# Patient Record
Sex: Male | Born: 1950 | Race: White | Hispanic: Refuse to answer | Marital: Married | Smoking: Never smoker
Health system: Southern US, Community
[De-identification: ages and names within clinical notes are randomized; demographics above are authoritative.]

## PROBLEM LIST (undated history)

## (undated) DIAGNOSIS — F419 Anxiety disorder, unspecified: Secondary | ICD-10-CM

## (undated) DIAGNOSIS — Z87442 Personal history of urinary calculi: Secondary | ICD-10-CM

## (undated) DIAGNOSIS — J301 Allergic rhinitis due to pollen: Secondary | ICD-10-CM

## (undated) DIAGNOSIS — K219 Gastro-esophageal reflux disease without esophagitis: Secondary | ICD-10-CM

## (undated) DIAGNOSIS — E7800 Pure hypercholesterolemia, unspecified: Secondary | ICD-10-CM

## (undated) DIAGNOSIS — M1990 Unspecified osteoarthritis, unspecified site: Secondary | ICD-10-CM

## (undated) DIAGNOSIS — F329 Major depressive disorder, single episode, unspecified: Secondary | ICD-10-CM

## (undated) DIAGNOSIS — I10 Essential (primary) hypertension: Secondary | ICD-10-CM

## (undated) DIAGNOSIS — J189 Pneumonia, unspecified organism: Secondary | ICD-10-CM

## (undated) DIAGNOSIS — F32A Depression, unspecified: Secondary | ICD-10-CM

## (undated) HISTORY — PX: TONSILLECTOMY: SUR1361

## (undated) HISTORY — PX: COLONOSCOPY: SHX174

---

## 2003-07-26 ENCOUNTER — Emergency Department (HOSPITAL_COMMUNITY): Admission: EM | Admit: 2003-07-26 | Discharge: 2003-07-26 | Payer: Self-pay | Admitting: Emergency Medicine

## 2005-09-08 ENCOUNTER — Encounter: Admission: RE | Admit: 2005-09-08 | Discharge: 2005-09-08 | Payer: Self-pay | Admitting: Family Medicine

## 2006-08-24 DIAGNOSIS — J189 Pneumonia, unspecified organism: Secondary | ICD-10-CM

## 2006-08-24 HISTORY — DX: Pneumonia, unspecified organism: J18.9

## 2007-04-11 ENCOUNTER — Encounter: Admission: RE | Admit: 2007-04-11 | Discharge: 2007-04-11 | Payer: Self-pay | Admitting: Family Medicine

## 2013-02-13 ENCOUNTER — Encounter (HOSPITAL_COMMUNITY): Payer: Self-pay | Admitting: Pharmacy Technician

## 2013-02-13 NOTE — Progress Notes (Signed)
Surgery clearance note 12/22/12 Dr. Azucena Cecil on chart.

## 2013-02-13 NOTE — Patient Instructions (Addendum)
20 DAM ASHRAF  02/13/2013   Your procedure is scheduled on: 02/20/13  Report to [facility] at 11:00 AM.  Call this number if you have problems the morning of surgery 336-: [phone]   Remember:   Do not eat food After Midnight, clear liquids from midnight until 7:30 am on 02/20/13 then nothing.      Do not wear jewelry, make-up or nail polish.  Do not wear lotions, powders, or perfumes. You may wear deodorant.  Do not shave 48 hours prior to surgery. Men may shave face and neck.  Do not bring valuables to the hospital.  Contacts, dentures or bridgework may not be worn into surgery.  Leave suitcase in the car. After surgery it may be brought to your room.  For patients admitted to the hospital, checkout time is 11:00 AM the day of discharge.   Please read over the following fact sheets that you were given: MRSA Information, blood fact sheet, incentive spirometry fact sheet, clear liquids fact sheet.  Birdie Sons, RN  pre op nurse call if needed [phone]    FAILURE TO FOLLOW THESE INSTRUCTIONS MAY RESULT IN CANCELLATION OF YOUR SURGERY   Patient Signature: ___________________________________________

## 2013-02-14 ENCOUNTER — Encounter (HOSPITAL_COMMUNITY)
Admission: RE | Admit: 2013-02-14 | Discharge: 2013-02-14 | Disposition: A | Discharge status: Home / Self Care | Payer: PRIVATE HEALTH INSURANCE | Source: Ambulatory Visit | Attending: Orthopedic Surgery | Admitting: Orthopedic Surgery

## 2013-02-14 ENCOUNTER — Encounter (HOSPITAL_COMMUNITY): Payer: Self-pay

## 2013-02-14 DIAGNOSIS — Z01818 Encounter for other preprocedural examination: Secondary | ICD-10-CM | POA: Insufficient documentation

## 2013-02-14 DIAGNOSIS — Z01812 Encounter for preprocedural laboratory examination: Secondary | ICD-10-CM | POA: Insufficient documentation

## 2013-02-14 HISTORY — DX: Gastro-esophageal reflux disease without esophagitis: K21.9

## 2013-02-14 HISTORY — DX: Major depressive disorder, single episode, unspecified: F32.9

## 2013-02-14 HISTORY — DX: Allergic rhinitis due to pollen: J30.1

## 2013-02-14 HISTORY — DX: Anxiety disorder, unspecified: F41.9

## 2013-02-14 HISTORY — DX: Unspecified osteoarthritis, unspecified site: M19.90

## 2013-02-14 HISTORY — DX: Pneumonia, unspecified organism: J18.9

## 2013-02-14 HISTORY — DX: Personal history of urinary calculi: Z87.442

## 2013-02-14 HISTORY — DX: Depression, unspecified: F32.A

## 2013-02-14 LAB — BASIC METABOLIC PANEL
BUN: 14 mg/dL (ref 6–23)
CO2: 28 mEq/L (ref 19–32)
Calcium: 9.6 mg/dL (ref 8.4–10.5)
Chloride: 105 mEq/L (ref 96–112)
Creatinine, Ser: 0.94 mg/dL (ref 0.50–1.35)
GFR calc Af Amer: >90 mL/min (ref >90)
GFR calc non Af Amer: 88 mL/min — ABNORMAL LOW (ref >90)
Glucose, Bld: 98 mg/dL (ref 70–99)
Potassium: 4.6 mEq/L (ref 3.5–5.1)
Sodium: 139 mEq/L (ref 135–145)

## 2013-02-14 LAB — URINALYSIS, ROUTINE W REFLEX MICROSCOPIC
Bilirubin Urine: NEGATIVE
Nitrite: NEGATIVE
Specific Gravity, Urine: 1.03 (ref 1.005–1.030)
Urobilinogen, UA: 0.2 mg/dL (ref 0.0–1.0)

## 2013-02-14 LAB — CBC
HCT: 44 % (ref 39.0–52.0)
Hemoglobin: 14.6 g/dL (ref 13.0–17.0)
MCH: 27.8 pg (ref 26.0–34.0)
MCHC: 33.2 g/dL (ref 30.0–36.0)
MCV: 83.8 fL (ref 78.0–100.0)
Platelets: 176 10*3/uL (ref 150–400)
RBC: 5.25 MIL/uL (ref 4.22–5.81)
RDW: 13.6 % (ref 11.5–15.5)
WBC: 4.9 10*3/uL (ref 4.0–10.5)

## 2013-02-14 LAB — PROTIME-INR
INR: 0.95 (ref 0.00–1.49)
Prothrombin Time: 12.6 seconds (ref 11.6–15.2)

## 2013-02-14 LAB — SURGICAL PCR SCREEN
MRSA, PCR: NEGATIVE
Staphylococcus aureus: NEGATIVE

## 2013-02-14 LAB — APTT: aPTT: 30 seconds (ref 24–37)

## 2013-02-16 NOTE — H&P (Signed)
TOTAL KNEE ADMISSION H&P  Patient is being admitted for right medial unicomartmental knee arthroplasty.  Subjective:  Chief Complaint:  Right knee medial compartment OA / pain.  HPI: Lucas Santiago, 62 y.o. male, has a history of pain and functional disability in the right knee due to arthritis and has failed non-surgical conservative treatments for greater than 12 weeks to include  NSAID's and/or analgesics, corticosteriod injections, viscosupplementation injections and activity modification.  Onset of symptoms was gradual, starting 2 years ago with rapidlly worsening course since that time. The patient noted no past surgery on the right knee(s).  Patient currently rates pain in the right knee(s) at 6 out of 10 with activity. Patient has worsening of pain with activity and weight bearing, pain that interferes with activities of daily living, pain with passive range of motion, crepitus and joint swelling.  Patient has evidence of periarticular osteophytes and joint space narrowing of the medial compartment by imaging studies.  There is no active signs of infection.  Risks, benefits and expectations were discussed with the patient. Patient understand the risks, benefits and expectations and wishes to proceed with surgery.   D/C Plans:   Home with HHPT  Post-op Meds:   Rx given for ASA, Zanaflex, Iron, Colace and MiraLax  Tranexamic Acid:   To be given  Decadron:    To be given  FYI:    ASA post-op    Past Medical History  Diagnosis Date  . 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 Anxiety   . Depression   . Pneumonia 2008    x2  . History of kidney stones 15 years ago  . GERD (gastroesophageal reflux disease)   . Arthritis     knees    Past Surgical History  Procedure Laterality Date  . Tonsillectomy  as child     Allergies:   NKDA    History  Substance Use Topics  . Smoking status: Never Smoker   . Smokeless tobacco: Never Used  . Alcohol Use: Yes     Comment: occasional        Review of Systems  Constitutional: Negative.   HENT: Negative.   Eyes: Negative.   Respiratory: Negative.   Cardiovascular: Negative.   Gastrointestinal: Positive for heartburn.  Genitourinary: Negative.   Musculoskeletal: Positive for joint pain.  Skin: Negative.   Neurological: Negative.   Endo/Heme/Allergies: Positive for environmental allergies.  Psychiatric/Behavioral: Positive for depression. The patient is nervous/anxious.     Objective:  Physical Exam  Constitutional: He is oriented to person, place, and time. He appears well-developed and well-nourished.  HENT:  Head: Normocephalic and atraumatic.  Mouth/Throat: Oropharynx is clear and moist.  Eyes: Pupils are equal, round, and reactive to light.  Neck: Neck supple. No JVD present. No tracheal deviation present. No thyromegaly present.  Cardiovascular: Normal rate, regular rhythm, normal heart sounds and intact distal pulses.   Respiratory: Effort normal and breath sounds normal. No stridor. No respiratory distress. He has no wheezes.  GI: Soft. There is no tenderness. There is no guarding.  Musculoskeletal:       Right knee: He exhibits decreased range of motion, swelling and bony tenderness. He exhibits no effusion, no ecchymosis, no deformity, no laceration and no erythema. Tenderness found. Medial joint line tenderness noted. No lateral joint line tenderness noted.  Lymphadenopathy:    He has no cervical adenopathy.  Neurological: He is alert and oriented to person, place, and time.  Skin: Skin is  warm and dry.  Psychiatric: He has a normal mood and affect.     Imaging Review Plain radiographs demonstrate moderate degenerative joint disease of the right knee, medial compartment. The overall alignment is neutral. The bone quality appears to be good for age and reported activity level.  Assessment/Plan:  End stage arthritis, right knee, medial compartment.  The patient history, physical examination, clinical  judgment of the provider and imaging studies are consistent with end stage degenerative joint disease of the right knee(s) and medial unicompartmental knee arthroplasty is deemed medically necessary. The treatment options including medical management, injection therapy arthroscopy and arthroplasty were discussed at length. The risks and benefits of total knee arthroplasty were presented and reviewed. The risks due to aseptic loosening, infection, stiffness, patella tracking problems, thromboembolic complications and other imponderables were discussed. The patient acknowledged the explanation, agreed to proceed with the plan and consent was signed. Patient is being admitted for inpatient treatment for surgery, pain control, PT, OT, prophylactic antibiotics, VTE prophylaxis, progressive ambulation and ADL's and discharge planning. The patient is planning to be discharged home with home health services.   Anastasio Auerbach Penney Domanski   PAC  02/16/2013, 10:32 AM

## 2013-02-20 ENCOUNTER — Encounter (HOSPITAL_COMMUNITY): Payer: Self-pay | Admitting: Anesthesiology

## 2013-02-20 ENCOUNTER — Inpatient Hospital Stay (HOSPITAL_COMMUNITY)
Admission: RE | Admit: 2013-02-20 | Discharge: 2013-02-21 | DRG: 470 | Disposition: A | Discharge status: Home Health | Payer: PRIVATE HEALTH INSURANCE | Source: Ambulatory Visit | Attending: Orthopedic Surgery | Admitting: Orthopedic Surgery

## 2013-02-20 ENCOUNTER — Encounter (HOSPITAL_COMMUNITY): Payer: Self-pay | Admitting: *Deleted

## 2013-02-20 ENCOUNTER — Encounter (HOSPITAL_COMMUNITY)
Admission: RE | Disposition: A | Discharge status: Home Health | Payer: Self-pay | Source: Ambulatory Visit | Attending: Orthopedic Surgery

## 2013-02-20 ENCOUNTER — Ambulatory Visit (HOSPITAL_COMMUNITY): Payer: PRIVATE HEALTH INSURANCE | Admitting: Anesthesiology

## 2013-02-20 DIAGNOSIS — Z96651 Presence of right artificial knee joint: Secondary | ICD-10-CM

## 2013-02-20 DIAGNOSIS — E663 Overweight: Secondary | ICD-10-CM

## 2013-02-20 DIAGNOSIS — Z01812 Encounter for preprocedural laboratory examination: Secondary | ICD-10-CM

## 2013-02-20 DIAGNOSIS — M1710 Unilateral primary osteoarthritis, unspecified knee: Principal | ICD-10-CM | POA: Diagnosis present

## 2013-02-20 HISTORY — PX: PARTIAL KNEE ARTHROPLASTY: SHX2174

## 2013-02-20 LAB — TYPE AND SCREEN
ABO/RH(D): O POS
Antibody Screen: NEGATIVE

## 2013-02-20 SURGERY — ARTHROPLASTY, KNEE, UNICOMPARTMENTAL
Anesthesia: Spinal | Site: Knee | Laterality: Right | Wound class: Clean

## 2013-02-20 MED ORDER — CEFAZOLIN SODIUM-DEXTROSE 2-3 GM-% IV SOLR
2.0000 g | INTRAVENOUS | Status: AC
  Administered 2013-02-20: 2 g via INTRAVENOUS

## 2013-02-20 MED ORDER — MEPERIDINE HCL 50 MG/ML IJ SOLN
6.2500 mg | INTRAMUSCULAR | Status: DC | PRN
  Administered 2013-02-20: 12.5 mg via INTRAVENOUS

## 2013-02-20 MED ORDER — LIDOCAINE HCL 2 % EX GEL
Freq: Once | CUTANEOUS | Status: AC
  Administered 2013-02-21: 5 via URETHRAL
  Filled 2013-02-20: qty 5

## 2013-02-20 MED ORDER — METHOCARBAMOL 500 MG PO TABS: 500.0000 mg | ORAL_TABLET | Freq: Four times a day (QID) | ORAL | Status: DC | PRN

## 2013-02-20 MED ORDER — BISACODYL 10 MG RE SUPP: 10.0000 mg | Freq: Every day | RECTAL | Status: DC | PRN

## 2013-02-20 MED ORDER — METHOCARBAMOL 100 MG/ML IJ SOLN
500.0000 mg | Freq: Four times a day (QID) | INTRAVENOUS | Status: DC | PRN
  Administered 2013-02-20: 500 mg via INTRAVENOUS
  Filled 2013-02-20: qty 5

## 2013-02-20 MED ORDER — HYDROMORPHONE HCL PF 1 MG/ML IJ SOLN: 0.5000 mg | INTRAMUSCULAR | Status: DC | PRN

## 2013-02-20 MED ORDER — MIDAZOLAM HCL 5 MG/5ML IJ SOLN
INTRAMUSCULAR | Status: DC | PRN
  Administered 2013-02-20: 2 mg via INTRAVENOUS

## 2013-02-20 MED ORDER — LACTATED RINGERS IV SOLN
INTRAVENOUS | Status: DC
  Administered 2013-02-20: 1000 mL via INTRAVENOUS
  Administered 2013-02-20: 15:00:00 via INTRAVENOUS

## 2013-02-20 MED ORDER — SODIUM CHLORIDE 0.9 % IV SOLN
INTRAVENOUS | Status: DC
  Administered 2013-02-20 – 2013-02-21 (×2): via INTRAVENOUS
  Filled 2013-02-20 (×5): qty 1000

## 2013-02-20 MED ORDER — PHENOL 1.4 % MT LIQD: 1.0000 | OROMUCOSAL | Status: DC | PRN

## 2013-02-20 MED ORDER — CEFAZOLIN SODIUM-DEXTROSE 2-3 GM-% IV SOLR
2.0000 g | Freq: Four times a day (QID) | INTRAVENOUS | Status: AC
  Administered 2013-02-20 – 2013-02-21 (×2): 2 g via INTRAVENOUS
  Filled 2013-02-20 (×2): qty 50

## 2013-02-20 MED ORDER — PHENYLEPHRINE HCL 10 MG/ML IJ SOLN
10.0000 mg | INTRAVENOUS | Status: DC | PRN
  Administered 2013-02-20: 40 ug/min via INTRAVENOUS

## 2013-02-20 MED ORDER — SODIUM CHLORIDE 0.9 % IJ SOLN
INTRAMUSCULAR | Status: DC | PRN
  Administered 2013-02-20: 16:00:00

## 2013-02-20 MED ORDER — PROPOFOL 10 MG/ML IV BOLUS
INTRAVENOUS | Status: DC | PRN
  Administered 2013-02-20: 30 mg via INTRAVENOUS

## 2013-02-20 MED ORDER — MENTHOL 3 MG MT LOZG: 1.0000 | LOZENGE | OROMUCOSAL | Status: DC | PRN

## 2013-02-20 MED ORDER — HYDROMORPHONE HCL PF 1 MG/ML IJ SOLN: 0.2500 mg | INTRAMUSCULAR | Status: DC | PRN

## 2013-02-20 MED ORDER — BUPIVACAINE-EPINEPHRINE 0.25% -1:200000 IJ SOLN
INTRAMUSCULAR | Status: DC | PRN
  Administered 2013-02-20: 25 mL

## 2013-02-20 MED ORDER — METOCLOPRAMIDE HCL 10 MG PO TABS: 5.0000 mg | ORAL_TABLET | Freq: Three times a day (TID) | ORAL | Status: DC | PRN

## 2013-02-20 MED ORDER — LACTATED RINGERS IV SOLN: INTRAVENOUS | Status: DC

## 2013-02-20 MED ORDER — ALUM & MAG HYDROXIDE-SIMETH 200-200-20 MG/5ML PO SUSP: 30.0000 mL | ORAL | Status: DC | PRN

## 2013-02-20 MED ORDER — FAMOTIDINE 40 MG PO TABS
40.0000 mg | ORAL_TABLET | Freq: Every day | ORAL | Status: DC
  Administered 2013-02-20: 40 mg via ORAL
  Filled 2013-02-20 (×2): qty 1

## 2013-02-20 MED ORDER — ZOLPIDEM TARTRATE 5 MG PO TABS: 5.0000 mg | ORAL_TABLET | Freq: Every evening | ORAL | Status: DC | PRN

## 2013-02-20 MED ORDER — CELECOXIB 200 MG PO CAPS
200.0000 mg | ORAL_CAPSULE | Freq: Two times a day (BID) | ORAL | Status: DC
  Administered 2013-02-20 – 2013-02-21 (×2): 200 mg via ORAL
  Filled 2013-02-20 (×3): qty 1

## 2013-02-20 MED ORDER — 0.9 % SODIUM CHLORIDE (POUR BTL) OPTIME
TOPICAL | Status: DC | PRN
  Administered 2013-02-20: 1000 mL

## 2013-02-20 MED ORDER — ONDANSETRON HCL 4 MG/2ML IJ SOLN: 4.0000 mg | Freq: Four times a day (QID) | INTRAMUSCULAR | Status: DC | PRN

## 2013-02-20 MED ORDER — KETOROLAC TROMETHAMINE 15 MG/ML IJ SOLN
INTRAMUSCULAR | Status: DC | PRN
  Administered 2013-02-20: 30 mg

## 2013-02-20 MED ORDER — FLEET ENEMA 7-19 GM/118ML RE ENEM: 1.0000 | ENEMA | Freq: Once | RECTAL | Status: AC | PRN

## 2013-02-20 MED ORDER — FENTANYL CITRATE 0.05 MG/ML IJ SOLN
INTRAMUSCULAR | Status: DC | PRN
  Administered 2013-02-20 (×2): 50 ug via INTRAVENOUS

## 2013-02-20 MED ORDER — DIPHENHYDRAMINE HCL 25 MG PO CAPS: 25.0000 mg | ORAL_CAPSULE | Freq: Four times a day (QID) | ORAL | Status: DC | PRN

## 2013-02-20 MED ORDER — ASPIRIN EC 325 MG PO TBEC
325.0000 mg | DELAYED_RELEASE_TABLET | Freq: Two times a day (BID) | ORAL | Status: DC
  Administered 2013-02-21: 325 mg via ORAL
  Filled 2013-02-20 (×3): qty 1

## 2013-02-20 MED ORDER — DEXAMETHASONE SODIUM PHOSPHATE 10 MG/ML IJ SOLN
10.0000 mg | Freq: Once | INTRAMUSCULAR | Status: DC
  Filled 2013-02-20: qty 1

## 2013-02-20 MED ORDER — DEXAMETHASONE SODIUM PHOSPHATE 10 MG/ML IJ SOLN
10.0000 mg | Freq: Once | INTRAMUSCULAR | Status: AC
  Administered 2013-02-20: 10 mg via INTRAVENOUS

## 2013-02-20 MED ORDER — POLYETHYLENE GLYCOL 3350 17 G PO PACK
17.0000 g | PACK | Freq: Two times a day (BID) | ORAL | Status: DC
  Administered 2013-02-20 – 2013-02-21 (×2): 17 g via ORAL

## 2013-02-20 MED ORDER — METOCLOPRAMIDE HCL 5 MG/ML IJ SOLN: 5.0000 mg | Freq: Three times a day (TID) | INTRAMUSCULAR | Status: DC | PRN

## 2013-02-20 MED ORDER — BUPIVACAINE LIPOSOME 1.3 % IJ SUSP
20.0000 mL | Freq: Once | INTRAMUSCULAR | Status: DC
  Filled 2013-02-20: qty 20

## 2013-02-20 MED ORDER — ONDANSETRON HCL 4 MG PO TABS: 4.0000 mg | ORAL_TABLET | Freq: Four times a day (QID) | ORAL | Status: DC | PRN

## 2013-02-20 MED ORDER — TRANEXAMIC ACID 100 MG/ML IV SOLN
1000.0000 mg | Freq: Once | INTRAVENOUS | Status: AC
  Administered 2013-02-20: 1000 mg via INTRAVENOUS
  Filled 2013-02-20: qty 10

## 2013-02-20 MED ORDER — PROPOFOL INFUSION 10 MG/ML OPTIME
INTRAVENOUS | Status: DC | PRN
  Administered 2013-02-20: 75 ug/kg/min via INTRAVENOUS

## 2013-02-20 MED ORDER — DOCUSATE SODIUM 100 MG PO CAPS
100.0000 mg | ORAL_CAPSULE | Freq: Two times a day (BID) | ORAL | Status: DC
  Administered 2013-02-20 – 2013-02-21 (×2): 100 mg via ORAL

## 2013-02-20 MED ORDER — FERROUS SULFATE 325 (65 FE) MG PO TABS
325.0000 mg | ORAL_TABLET | Freq: Three times a day (TID) | ORAL | Status: DC
  Administered 2013-02-21: 325 mg via ORAL
  Filled 2013-02-20 (×4): qty 1

## 2013-02-20 MED ORDER — ONDANSETRON HCL 4 MG/2ML IJ SOLN
INTRAMUSCULAR | Status: DC | PRN
  Administered 2013-02-20: 4 mg via INTRAVENOUS

## 2013-02-20 MED ORDER — BUPIVACAINE HCL (PF) 0.75 % IJ SOLN
INTRAMUSCULAR | Status: DC | PRN
  Administered 2013-02-20: 15 mg

## 2013-02-20 MED ORDER — HYDROCODONE-ACETAMINOPHEN 7.5-325 MG PO TABS
1.0000 | ORAL_TABLET | ORAL | Status: DC
  Administered 2013-02-20 – 2013-02-21 (×5): 1 via ORAL
  Filled 2013-02-20 (×5): qty 1

## 2013-02-20 SURGICAL SUPPLY — 55 items
ADH SKN CLS APL DERMABOND .7 (GAUZE/BANDAGES/DRESSINGS) ×1
BAG SPEC THK2 15X12 ZIP CLS (MISCELLANEOUS) ×1
BAG ZIPLOCK 12X15 (MISCELLANEOUS) ×2 IMPLANT
BANDAGE ELASTIC 6 VELCRO ST LF (GAUZE/BANDAGES/DRESSINGS) ×2 IMPLANT
BANDAGE ESMARK 6X9 LF (GAUZE/BANDAGES/DRESSINGS) ×1 IMPLANT
BLADE SAW RECIPROCATING 77.5 (BLADE) ×2 IMPLANT
BLADE SAW SGTL 13.0X1.19X90.0M (BLADE) ×2 IMPLANT
BNDG CMPR 9X6 STRL LF SNTH (GAUZE/BANDAGES/DRESSINGS) ×1
BNDG ESMARK 6X9 LF (GAUZE/BANDAGES/DRESSINGS) ×2
BOWL SMART MIX CTS (DISPOSABLE) ×2 IMPLANT
CAPT KNEE OXFORD ×1 IMPLANT
CATH FOLEY 2WAY SLVR  5CC 16FR (CATHETERS) ×1
CATH FOLEY 2WAY SLVR 5CC 16FR (CATHETERS) IMPLANT
CEMENT HV SMART SET (Cement) ×2 IMPLANT
CLOTH BEACON ORANGE TIMEOUT ST (SAFETY) ×2 IMPLANT
CUFF TOURN SGL QUICK 34 (TOURNIQUET CUFF) ×2
CUFF TRNQT CYL 34X4X40X1 (TOURNIQUET CUFF) ×1 IMPLANT
DERMABOND ADVANCED (GAUZE/BANDAGES/DRESSINGS) ×1
DERMABOND ADVANCED .7 DNX12 (GAUZE/BANDAGES/DRESSINGS) ×1 IMPLANT
DRAPE EXTREMITY T 121X128X90 (DRAPE) ×2 IMPLANT
DRAPE POUCH INSTRU U-SHP 10X18 (DRAPES) ×2 IMPLANT
DRAPE U-SHAPE 47X51 STRL (DRAPES) ×2 IMPLANT
DRSG AQUACEL AG ADV 3.5X 6 (GAUZE/BANDAGES/DRESSINGS) ×1 IMPLANT
DRSG AQUACEL AG ADV 3.5X10 (GAUZE/BANDAGES/DRESSINGS) ×2 IMPLANT
DRSG TEGADERM 4X4.75 (GAUZE/BANDAGES/DRESSINGS) ×2 IMPLANT
DURAPREP 26ML APPLICATOR (WOUND CARE) ×2 IMPLANT
ELECT REM PT RETURN 9FT ADLT (ELECTROSURGICAL) ×2
ELECTRODE REM PT RTRN 9FT ADLT (ELECTROSURGICAL) ×1 IMPLANT
EVACUATOR 1/8 PVC DRAIN (DRAIN) ×2 IMPLANT
FACESHIELD LNG OPTICON STERILE (SAFETY) ×8 IMPLANT
GAUZE SPONGE 2X2 8PLY STRL LF (GAUZE/BANDAGES/DRESSINGS) ×1 IMPLANT
GLOVE BIOGEL PI IND STRL 7.5 (GLOVE) ×1 IMPLANT
GLOVE BIOGEL PI IND STRL 8 (GLOVE) IMPLANT
GLOVE BIOGEL PI INDICATOR 7.5 (GLOVE) ×1
GLOVE BIOGEL PI INDICATOR 8 (GLOVE) ×1
GLOVE ECLIPSE 8.0 STRL XLNG CF (GLOVE) ×2 IMPLANT
GLOVE ORTHO TXT STRL SZ7.5 (GLOVE) ×4 IMPLANT
GOWN BRE IMP PREV XXLGXLNG (GOWN DISPOSABLE) ×4 IMPLANT
GOWN STRL NON-REIN LRG LVL3 (GOWN DISPOSABLE) ×2 IMPLANT
KIT BASIN OR (CUSTOM PROCEDURE TRAY) ×2 IMPLANT
LEGGING LITHOTOMY PAIR STRL (DRAPES) ×2 IMPLANT
MANIFOLD NEPTUNE II (INSTRUMENTS) ×2 IMPLANT
NDL SAFETY ECLIPSE 18X1.5 (NEEDLE) ×1 IMPLANT
NEEDLE HYPO 18GX1.5 SHARP (NEEDLE) ×2
PACK TOTAL JOINT (CUSTOM PROCEDURE TRAY) ×2 IMPLANT
POSITIONER SURGICAL ARM (MISCELLANEOUS) ×2 IMPLANT
SPONGE GAUZE 2X2 STER 10/PKG (GAUZE/BANDAGES/DRESSINGS) ×1
SUCTION FRAZIER TIP 10 FR DISP (SUCTIONS) ×2 IMPLANT
SUT MNCRL AB 4-0 PS2 18 (SUTURE) ×2 IMPLANT
SUT VIC AB 1 CT1 36 (SUTURE) ×4 IMPLANT
SUT VIC AB 2-0 CT1 27 (SUTURE) ×4
SUT VIC AB 2-0 CT1 TAPERPNT 27 (SUTURE) ×2 IMPLANT
SYR 50ML LL SCALE MARK (SYRINGE) ×2 IMPLANT
TOWEL OR 17X26 10 PK STRL BLUE (TOWEL DISPOSABLE) ×3 IMPLANT
TRAY FOLEY CATH 14FRSI W/METER (CATHETERS) ×2 IMPLANT

## 2013-02-20 NOTE — Transfer of Care (Signed)
Immediate Anesthesia Transfer of Care Note  Patient: Lucas Santiago  Procedure(s) Performed: Procedure(s): RIGHT KNEE MEDIAL UNICOMPARTMENTAL ARTHROPLASTY (Right)  Patient Location: PACU  Anesthesia Type:Spinal  Level of Consciousness: awake, sedated and patient cooperative  Airway & Oxygen Therapy: Patient Spontanous Breathing and Patient connected to face mask oxygen  Post-op Assessment: Report given to PACU RN, Post -op Vital signs reviewed and stable and SAB level T12.  Post vital signs: Reviewed and stable  Complications: No apparent anesthesia complications

## 2013-02-20 NOTE — Interval H&P Note (Signed)
History and Physical Interval Note:  02/20/2013 1:28 PM  Lucas Santiago  has presented today for surgery, with the diagnosis of Right Knee Medial compartmental Osteoarthritis  The various methods of treatment have been discussed with the patient and family. After consideration of risks, benefits and other options for treatment, the patient has consented to  Procedure(s): RIGHT UNICOMPARTMENTAL KNEE (Right) as a surgical intervention .  The patient's history has been reviewed, patient examined, no change in status, stable for surgery.  I have reviewed the patient's chart and labs.  Questions were answered to the patient's satisfaction.     Shelda Pal

## 2013-02-20 NOTE — Anesthesia Procedure Notes (Signed)
Spinal  Patient location during procedure: OR Start time: 02/20/2013 2:11 PM End time: 02/20/2013 2:15 PM Staffing CRNA/Resident: Paris Lore Performed by: resident/CRNA  Preanesthetic Checklist Completed: patient identified, site marked, surgical consent, pre-op evaluation, timeout performed, IV checked, risks and benefits discussed and monitors and equipment checked Spinal Block Patient position: sitting Prep: Betadine Patient monitoring: heart rate, continuous pulse ox and blood pressure Approach: right paramedian Location: L2-3 Injection technique: single-shot Needle Needle type: Spinocan and Sprotte  Needle gauge: 24 G Needle length: 9 cm Needle insertion depth: 4 cm Assessment Sensory level: T4 Additional Notes Expiration date of kit checked and confirmed. Patient tolerated procedure well, without complications. Sitting position X 1 attempt noted clear CSF return and easy aspiration and administration of medication.

## 2013-02-20 NOTE — Plan of Care (Signed)
Problem: Consults Goal: Diagnosis- Total Joint Replacement Outcome: Completed/Met Date Met:  02/20/13 Partial/Uniknee

## 2013-02-20 NOTE — Op Note (Signed)
NAME: Lucas Santiago    MEDICAL RECORD NO.: [id number]   FACILITY: [facility]   DATE OF BIRTH: July 20, 1951  PHYSICIAN: Madlyn Frankel. Charlann Boxer, M.D.    DATE OF PROCEDURE: 02/20/2013    OPERATIVE REPORT   PREOPERATIVE DIAGNOSIS: Right knee medial compartment osteoarthritis.   POSTOPERATIVE DIAGNOSIS: Right knee medial compartment osteoarthritis.  PROCEDURE: Right partial knee replacement utilizing Biomet Oxford knee  component, size medium femur, a right medial size A tibial tray with a size 5 insert.   SURGEON: Madlyn Frankel. Charlann Boxer, M.D.   ASSISTANT: Lanney Gins, PAC.  Please note that Mr. Lucas Santiago was present for the entirety of the case,  utilized for preoperative positioning, perioperative retractor  management, general facilitation of the case and primary wound closure.   ANESTHESIA: Spinal.   SPECIMENS: None.   COMPLICATIONS: None.  DRAINS: 1 medium HV   TOURNIQUET TIME: 38 minutes at 250 mmHg.   INDICATIONS FOR PROCEDURE: Mr. Lucas Santiago is a 61yo patient of mine who presented for evaluation of right knee pain.  He presented with primary complaints of pain on the medial side of their knee. Radiographs revealed advanced medial compartment arthritis with specifically an antero-medial wear pattern.  There was bone on bone changes noted with subchondral sclerosis and osteophytes present. The patient has had progressive problems failing to respond to conservative measures of medications, injections and activity modification. Risks of infection, DVT, component failure, need for future revision surgery were all discussed and reviewed.  Consent was obtained for benefit of pain relief.   PROCEDURE IN DETAIL: The patient was brought to the operative theater.  Once adequate anesthesia, preoperative antibiotics, 2gm Ancef administered, the patient was positioned in supine position with a right thigh tourniquet  placed. The right lower extremity was prepped and draped in sterile  fashion with the leg on the  Oxford leg holder.  The leg was allowed to flex to 120 degrees. A time-out  was performed identifying the patient, planned procedure, and extremity.  The leg was exsanguinated, tourniquet elevated to 250 mmHg. A midline  incision was made from the proximal pole of the patella to the tibial tubercle. A  soft tissue plane was created and partial median arthrotomy was then  made to allow for subluxation of the patella. Following initial synovectomy and  debridement, the osteophytes were removed off the medial aspect of the  knee.   Attention was first directed to the tibia. The tibial  extramedullary guide was positioned over the anterior crest of the tibia  and pinned into position, and using a measured resection guide from the  Oxford system, a 4 mm resection was made off the proximal tibia. First  the reciprocating saw along the medial aspect of the tibial spines, then the oscillating saw.    At this point, I sized this cut surface seem to be best fit for a size A or B tibial tray.  With the retractors out of the wound and the knee held at 90 degrees the 4 feeler gauge had appropriate tension on the medial ligament.   At this point, the femoral canal was opened with a drill and the  intramedullary rod passed. Then using the guide for a medium resection off  the posterior aspect of the femur was positioned over the mid portion of the medial femoral condyle.  The orientation was set using the guide that mates the femoral guide to the intramedullary rod.  The 2 drill holes were made into the distal femur.  The posterior guide  was then impacted into place and the posterior  femoral cut made.  At this point, I milled the distal femur with a size 4 spigot in place. At this point, we did a trial reduction of the medium femur, size A tibial tray and a 4 feeler gauge. At 90 degrees of  flexion and at 20 degrees of flexion the knee had symmetric tension on  the ligaments.   Given these findings,  the trial femoral component was removed. Final preparation of tibia was carried out by pinning it in position. Then  using a reciprocating saw I removed bone for the keel. Further bone was  removed with an osteotome.  Trial reduction was now carried out with the medium femur, the size A keeled tibia, and a 5 lollipop insert. The balance of the  ligaments appeared to be symmetric at 20 degrees and 90 degrees. Given  all these findings, the trial components were removed.   Cement was mixed. The final components were opened. The knee was irrigated with  normal saline solution. Then final debridements of the  soft tissue was carried out, I also drilled the sclerotic bone with a drill.  The final components were cemented with a single batch of cement in a  two-stage technique with the tibial component cemented first. The knee  was then brought  to 45 degrees of flexion with a 5 feeler gauge, held with pressure for a minute and half.  After this the femoral component was cemented in place.  The knee was again held at 45 degrees of flexion while the cement fully cured.  Excess cement was removed throughout the knee. Tourniquet was let down  after 38 minutes. After the cement had fully cured and excessive cement  was removed throughout the knee there was no visualized cement present.   The final size 5 medial insert to match a medium femur was chosen and snapped into position. We re-irrigated  the knee. I placed a medium Hemovac drain deep. The extensor mechanism  was then reapproximated using a #1 Vicryl with the knee in flexion. The  remaining wound was closed with 2-0 Vicryl and a running 4-0 Monocryl.  The knee was cleaned, dried, and dressed sterilely using Dermabond and  Aquacel dressing. The drain site was dressed separately. The patient  was brought to the recovery room, Ace wrap in place, tolerating the  procedure well. He will be in the hospital for overnight observation.  We will  initiate physical therapy and progress to ambulate.     Madlyn Frankel Charlann Boxer, M.D.

## 2013-02-20 NOTE — Anesthesia Preprocedure Evaluation (Signed)
Anesthesia Evaluation  Patient identified by MRN, date of birth, ID band Patient awake    Reviewed: Allergy & Precautions, H&P , NPO status , Patient's Chart, lab work & pertinent test results  Airway Mallampati: II TM Distance: >3 FB Neck ROM: full    Dental  (+) Caps and Dental Advisory Given 2 front upper teeth capped:   Pulmonary neg pulmonary ROS,  breath sounds clear to auscultation  Pulmonary exam normal       Cardiovascular Exercise Tolerance: Good negative cardio ROS  Rhythm:regular Rate:Normal     Neuro/Psych negative neurological ROS  negative psych ROS   GI/Hepatic negative GI ROS, Neg liver ROS, GERD-  Medicated and Controlled,  Endo/Other  negative endocrine ROS  Renal/GU negative Renal ROS  negative genitourinary   Musculoskeletal   Abdominal   Peds  Hematology negative hematology ROS (+)   Anesthesia Other Findings   Reproductive/Obstetrics negative OB ROS                           Anesthesia Physical Anesthesia Plan  ASA: I  Anesthesia Plan: Spinal   Post-op Pain Management:    Induction:   Airway Management Planned: Simple Face Mask  Additional Equipment:   Intra-op Plan:   Post-operative Plan:   Informed Consent: I have reviewed the patients History and Physical, chart, labs and discussed the procedure including the risks, benefits and alternatives for the proposed anesthesia with the patient or authorized representative who has indicated his/her understanding and acceptance.   Dental Advisory Given  Plan Discussed with: CRNA and Surgeon  Anesthesia Plan Comments:         Anesthesia Quick Evaluation

## 2013-02-20 NOTE — Anesthesia Postprocedure Evaluation (Signed)
  Anesthesia Post-op Note  Patient: Lucas Santiago  Procedure(s) Performed: Procedure(s) (LRB): RIGHT KNEE MEDIAL UNICOMPARTMENTAL ARTHROPLASTY (Right)  Patient Location: PACU  Anesthesia Type: Spinal  Level of Consciousness: awake and alert   Airway and Oxygen Therapy: Patient Spontanous Breathing  Post-op Pain: mild  Post-op Assessment: Post-op Vital signs reviewed, Patient's Cardiovascular Status Stable, Respiratory Function Stable, Patent Airway and No signs of Nausea or vomiting  Last Vitals:  Filed Vitals:   02/20/13 1645  BP: 126/78  Pulse: 71  Temp:   Resp: 15    Post-op Vital Signs: stable   Complications: No apparent anesthesia complications

## 2013-02-21 ENCOUNTER — Encounter (HOSPITAL_COMMUNITY): Payer: Self-pay | Admitting: Orthopedic Surgery

## 2013-02-21 DIAGNOSIS — E663 Overweight: Secondary | ICD-10-CM

## 2013-02-21 LAB — CBC
Hemoglobin: 13.5 g/dL (ref 13.0–17.0)
MCH: 27.5 pg (ref 26.0–34.0)
MCV: 83.5 fL (ref 78.0–100.0)
RBC: 4.91 MIL/uL (ref 4.22–5.81)

## 2013-02-21 LAB — BASIC METABOLIC PANEL
CO2: 24 mEq/L (ref 19–32)
Calcium: 8.9 mg/dL (ref 8.4–10.5)
Chloride: 102 mEq/L (ref 96–112)
Creatinine, Ser: 0.84 mg/dL (ref 0.50–1.35)
Glucose, Bld: 148 mg/dL — ABNORMAL HIGH (ref 70–99)
Sodium: 136 mEq/L (ref 135–145)

## 2013-02-21 MED ORDER — HYDROCODONE-ACETAMINOPHEN 7.5-325 MG PO TABS: 1.0000 | ORAL_TABLET | ORAL | Status: DC | PRN

## 2013-02-21 MED ORDER — FERROUS SULFATE 325 (65 FE) MG PO TABS: 325.0000 mg | ORAL_TABLET | Freq: Three times a day (TID) | ORAL | Status: DC

## 2013-02-21 MED ORDER — DSS 100 MG PO CAPS: 100.0000 mg | ORAL_CAPSULE | Freq: Two times a day (BID) | ORAL | Status: DC

## 2013-02-21 MED ORDER — ASPIRIN 325 MG PO TBEC: 325.0000 mg | DELAYED_RELEASE_TABLET | Freq: Two times a day (BID) | ORAL | Status: AC

## 2013-02-21 MED ORDER — TIZANIDINE HCL 4 MG PO CAPS: 4.0000 mg | ORAL_CAPSULE | Freq: Three times a day (TID) | ORAL | Status: DC | PRN

## 2013-02-21 MED ORDER — POLYETHYLENE GLYCOL 3350 17 G PO PACK: 17.0000 g | PACK | Freq: Two times a day (BID) | ORAL | Status: DC

## 2013-02-21 NOTE — Progress Notes (Signed)
[facility]   [facility] is providing the following services: Commode (patient declined RW)  If patient discharges after hours, please call [phone].   Renard Hamper [phone] 02/21/2013, 10:36 AM

## 2013-02-21 NOTE — Progress Notes (Signed)
Pt to d/c home with [facility]. AVS reviewed and "My Chart" discussed with pt. Pt capable of verbalizing medications and follow-up appointments. Remains hemodynamically stable. No signs and symptoms of distress. Educated pt to return to ER in the case of SOB, dizziness, or chest pain.

## 2013-02-21 NOTE — Evaluation (Signed)
Physical Therapy Evaluation Patient Details Name: Lucas Santiago MRN: [id number] DOB: 01-22-1951 Today's Date: 02/21/2013 Time: 5621-3086 PT Time Calculation (min): 30 min  PT Assessment / Plan / Recommendation History of Present Illness  62 y.o. male admitted for R uniknee replacement.   Clinical Impression  *62 y.o. Male admitted for R uniknee replacement. Pt ambulated 400' with RW indpendently. Instructed pt in knee exercises. REady to DC home from PT standpoint. **    PT Assessment  All further PT needs can be met in the next venue of care    Follow Up Recommendations  Home health PT    Does the patient have the potential to tolerate intense rehabilitation      Barriers to Discharge        Equipment Recommendations  3in1 (PT)    Recommendations for Other Services     Frequency      Precautions / Restrictions Precautions Precautions: None Restrictions Weight Bearing Restrictions: No   Pertinent Vitals/Pain *3/10 at rest and with walking premedicated**      Mobility  Bed Mobility Bed Mobility: Supine to Sit Supine to Sit: 7: Independent Transfers Transfers: Sit to Stand;Stand to Sit Sit to Stand: 7: Independent;From bed Stand to Sit: 7: Independent;To chair/3-in-1 Ambulation/Gait Ambulation/Gait Assistance: 6: Modified independent (Device/Increase time) Ambulation Distance (Feet): 400 Feet Assistive device: Rolling walker Gait Pattern: Step-through pattern Gait velocity: WNL    Exercises Total Joint Exercises Ankle Circles/Pumps: AROM;Both;10 reps;Supine Quad Sets: AROM;Both;5 reps;Supine Towel Squeeze: AROM;Both;5 reps;Supine Short Arc Quad: AROM;Right Heel Slides: AAROM;10 reps;Supine;Right Hip ABduction/ADduction: AROM;5 reps;Right;Supine Straight Leg Raises: AROM;Right;5 reps;Supine Long Arc Quad: AROM;Right;5 reps;Seated Knee Flexion: AROM;Right;5 reps;Seated Goniometric ROM: R knee flexion 82* AROM supine, knee ext -2* AROM seated   PT  Diagnosis: Acute pain  PT Problem List: Decreased range of motion;Pain PT Treatment Interventions:       PT Goals(Current goals can be found in the care plan section)    Visit Information  Last PT Received On: 02/21/13 Assistance Needed: +1 History of Present Illness: 62 y.o. male admitted for R uniknee replacement.        Prior Functioning  Home Living Family/patient expects to be discharged to:: Private residence Living Arrangements: Spouse/significant other Available Help at Discharge: Family Type of Home: House Home Access: Ramped entrance Home Layout: One level Home Equipment: Shower seat;Grab bars - tub/shower;Grab bars - toilet;Walker - 2 wheels Prior Function Level of Independence: Independent Communication Communication: No difficulties    Cognition  Cognition Arousal/Alertness: Awake/alert Behavior During Therapy: WFL for tasks assessed/performed Overall Cognitive Status: Within Functional Limits for tasks assessed    Extremity/Trunk Assessment Lower Extremity Assessment Lower Extremity Assessment: RLE deficits/detail RLE Deficits / Details: R knee flexion AROM 82* in supine, knee ext -2* AROM sitting, able to do R SLR independently, R knee extension greater than or equal to 3/5   Balance    End of Session PT - End of Session Equipment Utilized During Treatment: Gait belt Activity Tolerance: Patient tolerated treatment well Patient left: in chair;with call bell/phone within reach Nurse Communication: Mobility status  GP     Ralene Bathe Kistler 02/21/2013, 11:09 AM [phone]

## 2013-02-21 NOTE — Progress Notes (Signed)
   Subjective: 1 Day Post-Op Procedure(s) (LRB): RIGHT KNEE MEDIAL UNICOMPARTMENTAL ARTHROPLASTY (Right)   Patient reports pain as mild, pain well controlled. No events throughout the night. Ready to be discharged home after PT.  Objective:   VITALS:   Filed Vitals:   02/21/13 0535  BP: 113/71  Pulse: 73  Temp: 98.3 F (36.8 C)  Resp: 18    Neurovascular intact Dorsiflexion/Plantar flexion intact Incision: dressing C/D/I No cellulitis present Compartment soft  LABS  Recent Labs  02/21/13 0513  HGB 13.5  HCT 41.0  WBC 15.7*  PLT 187     Recent Labs  02/21/13 0513  NA 136  K 4.3  BUN 12  CREATININE 0.84  GLUCOSE 148*     Assessment/Plan: 1 Day Post-Op Procedure(s) (LRB): RIGHT KNEE MEDIAL UNICOMPARTMENTAL ARTHROPLASTY (Right) Foley cath d/c'ed Advance diet Up with therapy D/C IV fluids Discharge home with home health Follow up in 2 weeks at [facility]. Follow up with OLIN,Mansur Patti D in 2 weeks.  Contact information:  [facility] [address] [place] [address] [phone]    Overweight (BMI 25-29.9) Estimated body mass index is 26.94 kg/(m^2) as calculated from the following:   Height as of this encounter: 5' 9.5" (1.765 m).   Weight as of this encounter: 83.915 kg (185 lb). Patient also counseled that weight may inhibit the healing process Patient counseled that losing weight will help with future health issues       Anastasio Auerbach. Florence Yeung   PAC  02/21/2013, 7:46 AM

## 2013-02-21 NOTE — Care Management Note (Signed)
    Page 1 of 2   02/21/2013     12:15:00 PM   CARE MANAGEMENT NOTE 02/21/2013  Patient:  Lucas Santiago, Lucas Santiago   Account Number:  [id number]  Date Initiated:  02/21/2013  Documentation initiated by:  Colleen Can  Subjective/Objective Assessment:   dx rt medial compartment osteoarthritis; partial knee replacemnt     Action/Plan:   CM spoke with patient and spouse. Plans are for pt to return to his home in [facility] where spouse will be caregiver. He already has RW but wants 3N1. [facility] will deliver to rm.   Anticipated DC Date:  02/21/2013   Anticipated DC Plan:  HOME W HOME HEALTH SERVICES      DC Planning Services  CM consult      [facility] Choice  HOME HEALTH  DURABLE MEDICAL EQUIPMENT   Choice offered to / List presented to:  [name]   DME arranged  3-N-1      DME agency  [facility]     HH arranged  HH-2 PT      [facility] agency  [facility]   Status of service:  Completed, signed off Medicare Important Message given?   (If response is "NO", the following Medicare IM given date fields will be blank) Date Medicare IM given:   Date Additional Medicare IM given:    Discharge Disposition:  HOME W HOME HEALTH SERVICES  Per UR Regulation:  Reviewed for med. necessity/level of care/duration of stay  If discussed at Long Length of Stay Meetings, dates discussed:    Comments:  02/21/2013 Colleen Can BSN RN CCM [phone] [name] will start [facility] services within 48hrs of discharge.

## 2013-02-28 NOTE — Discharge Summary (Signed)
Physician Discharge Summary  Patient ID: Lucas Santiago MRN: [id number] DOB/AGE: 05-17-1951 62 y.o.  Admit date: 02/20/2013 Discharge date: 02/21/2013   Procedures:  Procedure(s) (LRB): RIGHT KNEE MEDIAL UNICOMPARTMENTAL ARTHROPLASTY (Right)  Attending Physician:  Dr. 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 Anxiety   . Depression   . Pneumonia 2008    x2  . History of kidney stones 15 years ago  . GERD (gastroesophageal reflux disease)   . Arthritis     knees    HPI: Lucas Santiago, 62 y.o. male, has a history of pain and functional disability in the right knee due to arthritis and has failed non-surgical conservative treatments for greater than 12 weeks to include NSAID's and/or analgesics, corticosteriod injections, viscosupplementation injections and activity modification. Onset of symptoms was gradual, starting 2 years ago with rapidlly worsening course since that time. The patient noted no past surgery on the right knee(s). Patient currently rates pain in the right knee(s) at 6 out of 10 with activity. Patient has worsening of pain with activity and weight bearing, pain that interferes with activities of daily living, pain with passive range of motion, crepitus and joint swelling. Patient has evidence of periarticular osteophytes and joint space narrowing of the medial compartment by imaging studies. There is no active signs of infection. Risks, benefits and expectations were discussed with the patient. Patient understand the risks, benefits and expectations and wishes to proceed with surgery.    PCP: Sissy Hoff, MD   Discharged Condition: good  Hospital Course:  Patient underwent the above stated procedure on 02/20/2013. Patient tolerated the procedure  well and brought to the recovery room in good condition and subsequently to the floor.  POD #1 BP: 113/71 ; Pulse: 73 ; Temp: 98.3 F (36.8 C) ; Resp: 18  Pt's foley was removed, as well as the hemovac drain removed. IV was changed to a saline lock. Patient reports pain as mild, pain well controlled. No events throughout the night. Ready to be discharged home after PT. Neurovascular intact, dorsiflexion/plantar flexion intact, incision: dressing C/D/I, no cellulitis present and compartment soft.   LABS  Basename  02/21/13    0513   HGB  13.5  HCT  41.0    Discharge Exam: General appearance: alert, cooperative and no distress Extremities: Homans sign is negative, no sign of DVT, no edema, redness or tenderness in the calves or thighs and no ulcers, gangrene or trophic changes  Disposition:   Home-Health Care Svc with follow up in 2 weeks   Follow-up Information   Follow up with Shelda Pal, MD. Schedule an appointment as soon as possible for a visit in 2 weeks.   Contact information:   [address] [place] [address] [phone]       Discharge Orders   Future Orders Complete By Expires     Call MD / Call 911  As directed     Comments:      If you experience chest pain or shortness of breath, CALL 911 and be transported to the hospital emergency room.  If you develope a fever above 101 F, pus (white drainage) or increased drainage or redness at the wound, or calf pain, call your  surgeon's office.    Change dressing  As directed     Comments:      Maintain surgical dressing for 10-14 days, then change the dressing daily with sterile 4 x 4 inch gauze dressing and tape. Keep the area dry and clean.    Constipation Prevention  As directed     Comments:      Drink plenty of fluids.  Prune juice may be helpful.  You may use a stool softener, such as Colace (over the counter) 100 mg twice a day.  Use MiraLax (over the counter) for constipation as needed.    Diet  - low sodium heart healthy  As directed     Discharge instructions  As directed     Comments:      Maintain surgical dressing for 10-14 days, then replace with gauze and tape. Keep the area dry and clean until follow up. Follow up in 2 weeks at [facility]. Call with any questions or concerns.    Driving restrictions  As directed     Comments:      No driving for 4 weeks    Increase activity slowly as tolerated  As directed     TED hose  As directed     Comments:      Use stockings (TED hose) for 2 weeks on both leg(s).  You may remove them at night for sleeping.    Weight bearing as tolerated  As directed          Medication List    STOP taking these medications       ALEVE 220 MG tablet  Generic drug:  naproxen sodium      TAKE these medications       aspirin 325 MG EC tablet  Take 1 tablet (325 mg total) by mouth 2 (two) times daily.     DSS 100 MG Caps  Take 100 mg by mouth 2 (two) times daily.     ferrous sulfate 325 (65 FE) MG tablet  Take 1 tablet (325 mg total) by mouth 3 (three) times daily after meals.     HYDROcodone-acetaminophen 7.5-325 MG per tablet  Commonly known as:  NORCO  Take 1-2 tablets by mouth every 4 (four) hours as needed for pain.     polyethylene glycol packet  Commonly known as:  MIRALAX / GLYCOLAX  Take 17 g by mouth 2 (two) times daily.     ranitidine 300 MG tablet  Commonly known as:  ZANTAC  Take 300 mg by mouth at bedtime.     tiZANidine 4 MG capsule  Commonly known as:  ZANAFLEX  Take 1 capsule (4 mg total) by mouth 3 (three) times daily as needed for muscle spasms.         Signed: Anastasio Auerbach. Anyelina Claycomb   PAC  02/28/2013, 8:22 AM

## 2015-12-21 ENCOUNTER — Emergency Department (HOSPITAL_COMMUNITY)
Admission: EM | Admit: 2015-12-21 | Discharge: 2015-12-21 | Disposition: A | Discharge status: Home / Self Care | Payer: BLUE CROSS/BLUE SHIELD | Attending: Emergency Medicine | Admitting: Emergency Medicine

## 2015-12-21 ENCOUNTER — Emergency Department (HOSPITAL_COMMUNITY): Payer: BLUE CROSS/BLUE SHIELD

## 2015-12-21 ENCOUNTER — Encounter (HOSPITAL_COMMUNITY): Payer: Self-pay | Admitting: Emergency Medicine

## 2015-12-21 DIAGNOSIS — Z79899 Other long term (current) drug therapy: Secondary | ICD-10-CM | POA: Diagnosis not present

## 2015-12-21 DIAGNOSIS — R197 Diarrhea, unspecified: Secondary | ICD-10-CM | POA: Insufficient documentation

## 2015-12-21 DIAGNOSIS — R109 Unspecified abdominal pain: Secondary | ICD-10-CM | POA: Insufficient documentation

## 2015-12-21 DIAGNOSIS — R1013 Epigastric pain: Secondary | ICD-10-CM | POA: Diagnosis not present

## 2015-12-21 DIAGNOSIS — R0789 Other chest pain: Secondary | ICD-10-CM | POA: Insufficient documentation

## 2015-12-21 DIAGNOSIS — R079 Chest pain, unspecified: Secondary | ICD-10-CM

## 2015-12-21 DIAGNOSIS — R42 Dizziness and giddiness: Secondary | ICD-10-CM | POA: Insufficient documentation

## 2015-12-21 DIAGNOSIS — R110 Nausea: Secondary | ICD-10-CM | POA: Diagnosis not present

## 2015-12-21 HISTORY — DX: Essential (primary) hypertension: I10

## 2015-12-21 LAB — COMPREHENSIVE METABOLIC PANEL
ALK PHOS: 54 U/L (ref 38–126)
ALT: 23 U/L (ref 17–63)
ANION GAP: 11 (ref 5–15)
AST: 24 U/L (ref 15–41)
Albumin: 4 g/dL (ref 3.5–5.0)
BILIRUBIN TOTAL: 0.6 mg/dL (ref 0.3–1.2)
BUN: 9 mg/dL (ref 6–20)
CALCIUM: 9.6 mg/dL (ref 8.9–10.3)
CO2: 27 mmol/L (ref 22–32)
Chloride: 102 mmol/L (ref 101–111)
Creatinine, Ser: 1.07 mg/dL (ref 0.61–1.24)
GLUCOSE: 114 mg/dL — AB (ref 65–99)
POTASSIUM: 4.3 mmol/L (ref 3.5–5.1)
Sodium: 140 mmol/L (ref 135–145)
TOTAL PROTEIN: 6.4 g/dL — AB (ref 6.5–8.1)

## 2015-12-21 LAB — CBC WITH DIFFERENTIAL/PLATELET
BASOS ABS: 0 10*3/uL (ref 0.0–0.1)
BASOS PCT: 0 %
EOS ABS: 0.1 10*3/uL (ref 0.0–0.7)
Eosinophils Relative: 1 %
HCT: 44.1 % (ref 39.0–52.0)
HEMOGLOBIN: 13.6 g/dL (ref 13.0–17.0)
Lymphocytes Relative: 19 %
Lymphs Abs: 1.2 10*3/uL (ref 0.7–4.0)
MCH: 26.9 pg (ref 26.0–34.0)
MCHC: 30.8 g/dL (ref 30.0–36.0)
MCV: 87.3 fL (ref 78.0–100.0)
MONOS PCT: 5 %
Monocytes Absolute: 0.4 10*3/uL (ref 0.1–1.0)
NEUTROS ABS: 5 10*3/uL (ref 1.7–7.7)
NEUTROS PCT: 75 %
Platelets: 157 10*3/uL (ref 150–400)
RBC: 5.05 MIL/uL (ref 4.22–5.81)
RDW: 14 % (ref 11.5–15.5)
WBC: 6.6 10*3/uL (ref 4.0–10.5)

## 2015-12-21 LAB — I-STAT TROPONIN, ED
TROPONIN I, POC: 0 ng/mL (ref 0.00–0.08)
Troponin i, poc: 0 ng/mL (ref 0.00–0.08)

## 2015-12-21 LAB — LIPASE, BLOOD: LIPASE: 22 U/L (ref 11–51)

## 2015-12-21 MED ORDER — GI COCKTAIL ~~LOC~~
30.0000 mL | Freq: Once | ORAL | Status: AC
  Administered 2015-12-21: 30 mL via ORAL
  Filled 2015-12-21: qty 30

## 2015-12-21 NOTE — ED Notes (Signed)
Pt. Developed Epigastric pain today and it radiated into his central Chest.  Non-radiating pain.  Pt. Described the pain as tightness.  He received 324 mg Aspirin , nitro 1 tablet and Zofran 4mg  .  Pt. Is pain free and denies any nausea or sob.  NSR on the monitor.  Skin is warm, pink and dry

## 2015-12-21 NOTE — ED Notes (Signed)
Pt and caregiver verbalized understanding of discharge instructions and follow-up care.  

## 2015-12-21 NOTE — ED Provider Notes (Signed)
CSN: [id number]     Arrival date & time 12/21/15  1900 History   First MD Initiated Contact with Patient 12/21/15 1909     Chief Complaint  Patient presents with  . Chest Pain     (Consider location/radiation/quality/duration/timing/severity/associated sxs/prior Treatment) HPI 65 year old male presents with epigastric pain and chest tightness. He states that he was started on low started 4 days ago. Last night he developed a cough and had trouble going to bed. When he woke up at 11 AM he had epigastric abdominal pain. He felt bloated and had a loose bowel movement as well. Had the symptoms all day and then at 5 PM he noticed some chest tightness but no pain. Felt somewhat lightheaded. No shortness of breath, vomiting, or diaphoresis. He has been feeling somewhat nauseated. Was given aspirin and one nitroglycerin by EMS. Tightness has resolved but still feels some discomfort in his upper abdomen. Patient is concerned that it is the losartan causing the symptoms. He called his PCP was told to come to the ER for a chest pain workup. Hx of hypertension and family history of cardiac disease at his age.  Past Medical History  Diagnosis Date  . 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 Anxiety   . Depression   . Pneumonia 2008    x2  . History of kidney stones 15 years ago  . GERD (gastroesophageal reflux disease)   . Arthritis     knees  . Hypertension    Past Surgical History  Procedure Laterality Date  . Tonsillectomy  as child  . Partial knee arthroplasty Right 02/20/2013    Procedure: RIGHT KNEE MEDIAL UNICOMPARTMENTAL ARTHROPLASTY;  Surgeon: Shelda PalMatthew D Olin, MD;  Location: [facility] ORS;  Service: Orthopedics;  Laterality: Right;   No family history on file. Social History  Substance Use Topics  . Smoking status: Never Smoker   . Smokeless tobacco: Never Used  . Alcohol Use: Yes     Comment: occasional    Review of Systems  Constitutional: Negative for diaphoresis.  Respiratory: Positive  for chest tightness. Negative for shortness of breath.   Cardiovascular: Negative for leg swelling.  Gastrointestinal: Positive for nausea, abdominal pain and diarrhea. Negative for vomiting.  Neurological: Positive for light-headedness.  All other systems reviewed and are negative.     Allergies  Review of patient's allergies indicates no known allergies.  Home Medications   Prior to Admission medications   Medication Sig Start Date End Date Taking? Authorizing Provider  docusate sodium 100 MG CAPS Take 100 mg by mouth 2 (two) times daily. 02/21/13   Lanney GinsMatthew Babish, PA-C  ferrous sulfate 325 (65 FE) MG tablet Take 1 tablet (325 mg total) by mouth 3 (three) times daily after meals. 02/21/13   Lanney GinsMatthew Babish, PA-C  HYDROcodone-acetaminophen (NORCO) 7.5-325 MG per tablet Take 1-2 tablets by mouth every 4 (four) hours as needed for pain. 02/21/13   Lanney GinsMatthew Babish, PA-C  polyethylene glycol (MIRALAX / GLYCOLAX) packet Take 17 g by mouth 2 (two) times daily. 02/21/13   Lanney GinsMatthew Babish, PA-C  ranitidine (ZANTAC) 300 MG tablet Take 300 mg by mouth at bedtime.    Historical Provider, MD  tiZANidine (ZANAFLEX) 4 MG capsule Take 1 capsule (4 mg total) by mouth 3 (three) times daily as needed for muscle spasms. 02/21/13   Lanney GinsMatthew Babish, PA-C   BP 134/89 mmHg  Pulse 69  SpO2 98% Physical Exam  Constitutional: He is oriented to person, place, and time. He  appears well-developed and well-nourished.  HENT:  Head: Normocephalic and atraumatic.  Right Ear: External ear normal.  Left Ear: External ear normal.  Nose: Nose normal.  Eyes: Right eye exhibits no discharge. Left eye exhibits no discharge.  Neck: Neck supple.  Cardiovascular: Normal rate, regular rhythm, normal heart sounds and intact distal pulses.   Pulmonary/Chest: Effort normal and breath sounds normal. He exhibits no tenderness.  Abdominal: Soft. He exhibits no distension. There is no tenderness.  Musculoskeletal: He exhibits no edema.   Neurological: He is alert and oriented to person, place, and time.  Skin: Skin is warm and dry.  Nursing note and vitals reviewed.   ED Course  Procedures (including critical care time) Labs Review Labs Reviewed  COMPREHENSIVE METABOLIC PANEL - Abnormal; Notable for the following:    Glucose, Bld 114 (*)    Total Protein 6.4 (*)    All other components within normal limits  LIPASE, BLOOD  CBC WITH DIFFERENTIAL/PLATELET  I-STAT TROPOININ, ED  Rosezena Sensor, ED    Imaging Review Dg Chest 2 View  12/21/2015  CLINICAL DATA:  Chest pain shortness of breath and cough for 1 day EXAM: CHEST  2 VIEW COMPARISON:  None. FINDINGS: Normal heart size. No pleural effusion or edema. No airspace consolidation noted. Scar versus subsegmental atelectasis noted in the left base. Right lung is clear. IMPRESSION: 1. Left base scar versus subsegmental atelectasis. Electronically Signed   By: Signa Kell M.D.   On: 12/21/2015 20:21   I have personally reviewed and evaluated these images and lab results as part of my medical decision-making.   EKG Interpretation   Date/Time:  Saturday December 21 2015 23:03:59 EDT Ventricular Rate:  69 PR Interval:  141 QRS Duration: 90 QT Interval:  400 QTC Calculation: 428 R Axis:   101 Text Interpretation:  Sinus rhythm Right axis deviation no significant  change since earlier in the day Confirmed by Dmetrius Ambs  MD, Tirth Cothron ([id number]) on  12/21/2015 11:07:04 PM      MDM   Final diagnoses:  Nonspecific chest pain    Patient with nonspecific chest tightness and epigastric pain. This is atypical and I doubt ACS. ECG negative 2 and 2 negative troponins. Feels much better after GI cocktail. I highly doubt PE or dissection do not think testing is indicated. He feels and appears quite well. Patient wants to go home to follow-up close it with his PCP. Given his HEART score of 2 think this is reasonable and discussed return precautions.    Pricilla Loveless,  MD 12/22/15 [phone]

## 2017-01-03 IMAGING — DX DG CHEST 2V
2 series · 2 of 2 positions shown · non-contrast
Comparison: None.

CLINICAL DATA: Chest pain shortness of breath and cough for 1 day

EXAM:
CHEST  2 VIEW

[chest pa]
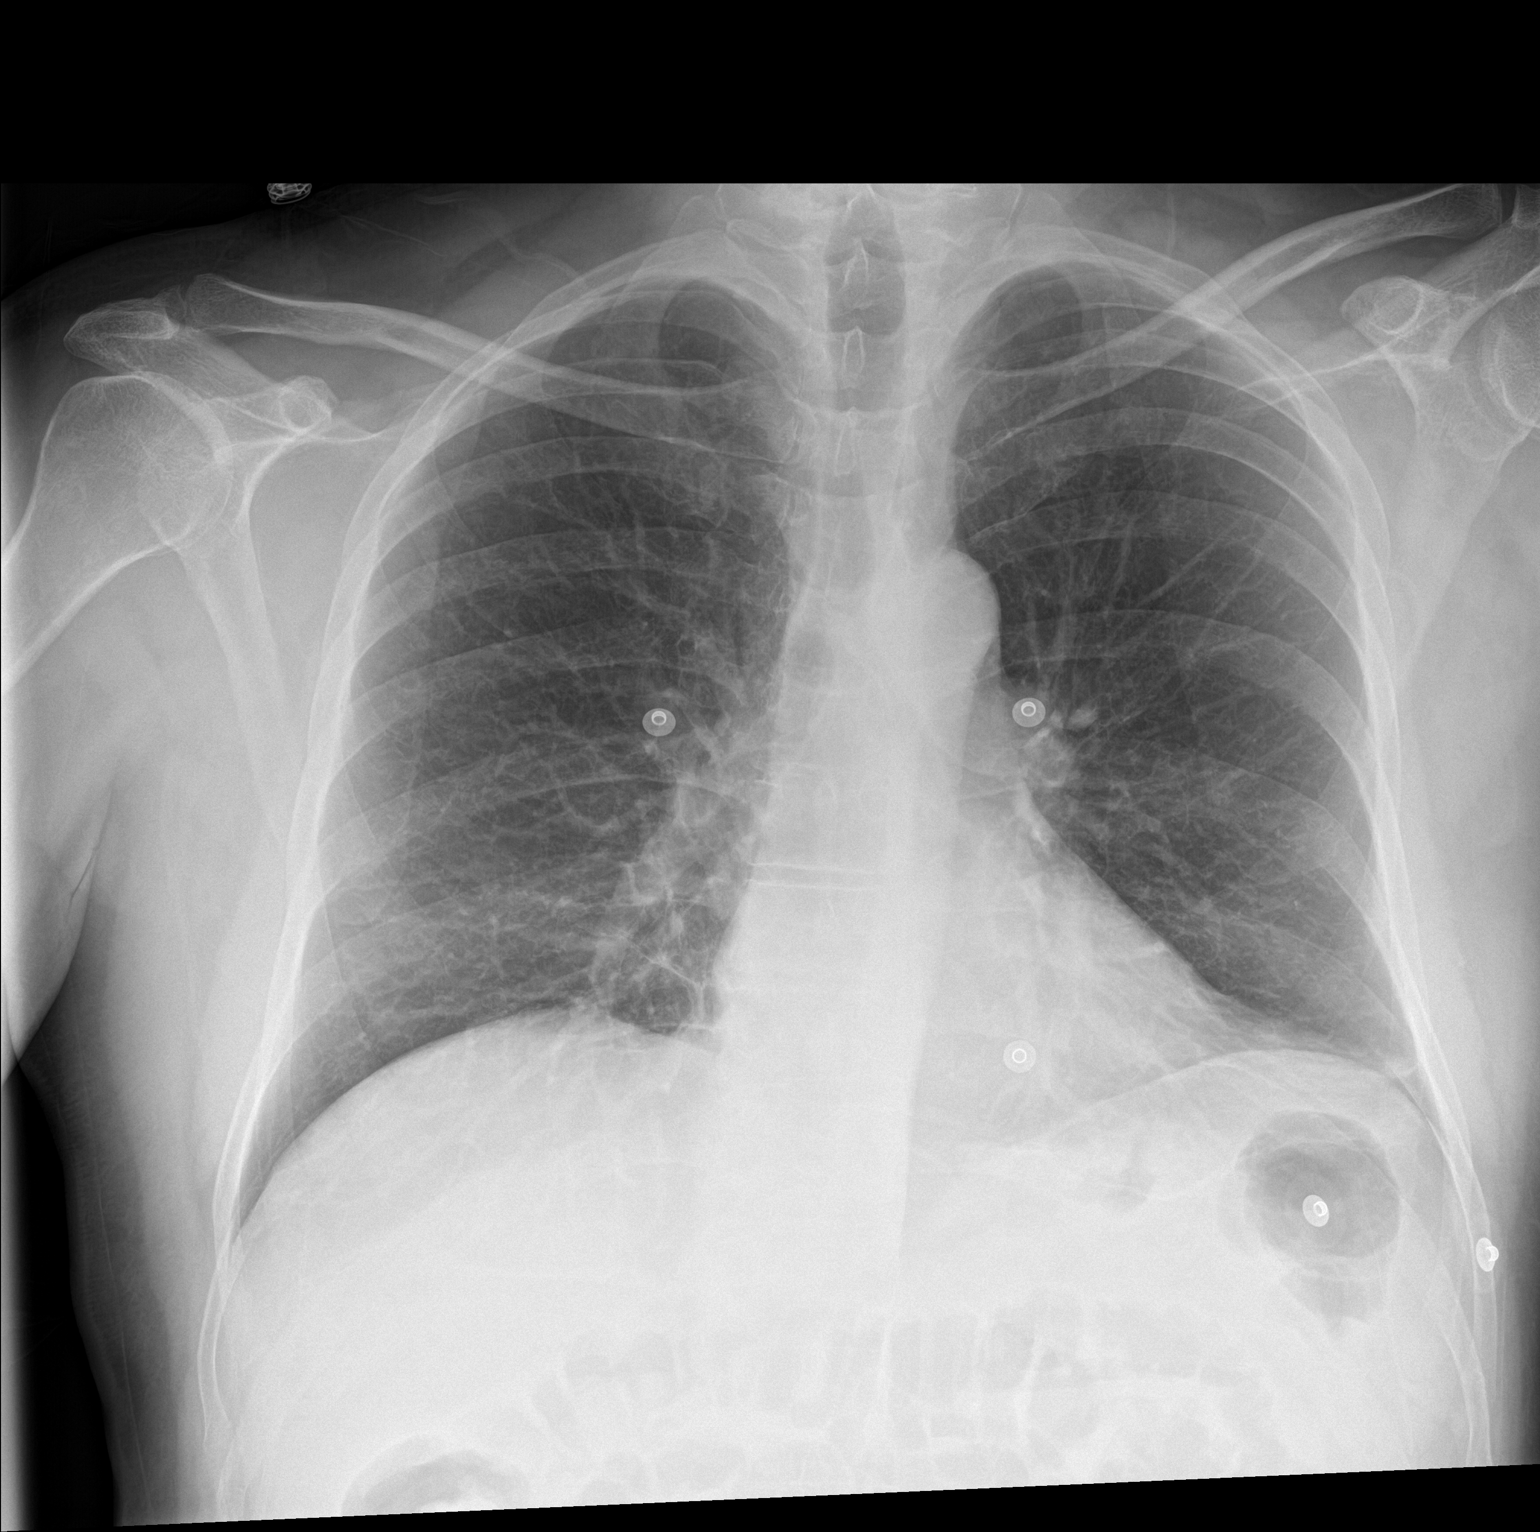

[chest lat]
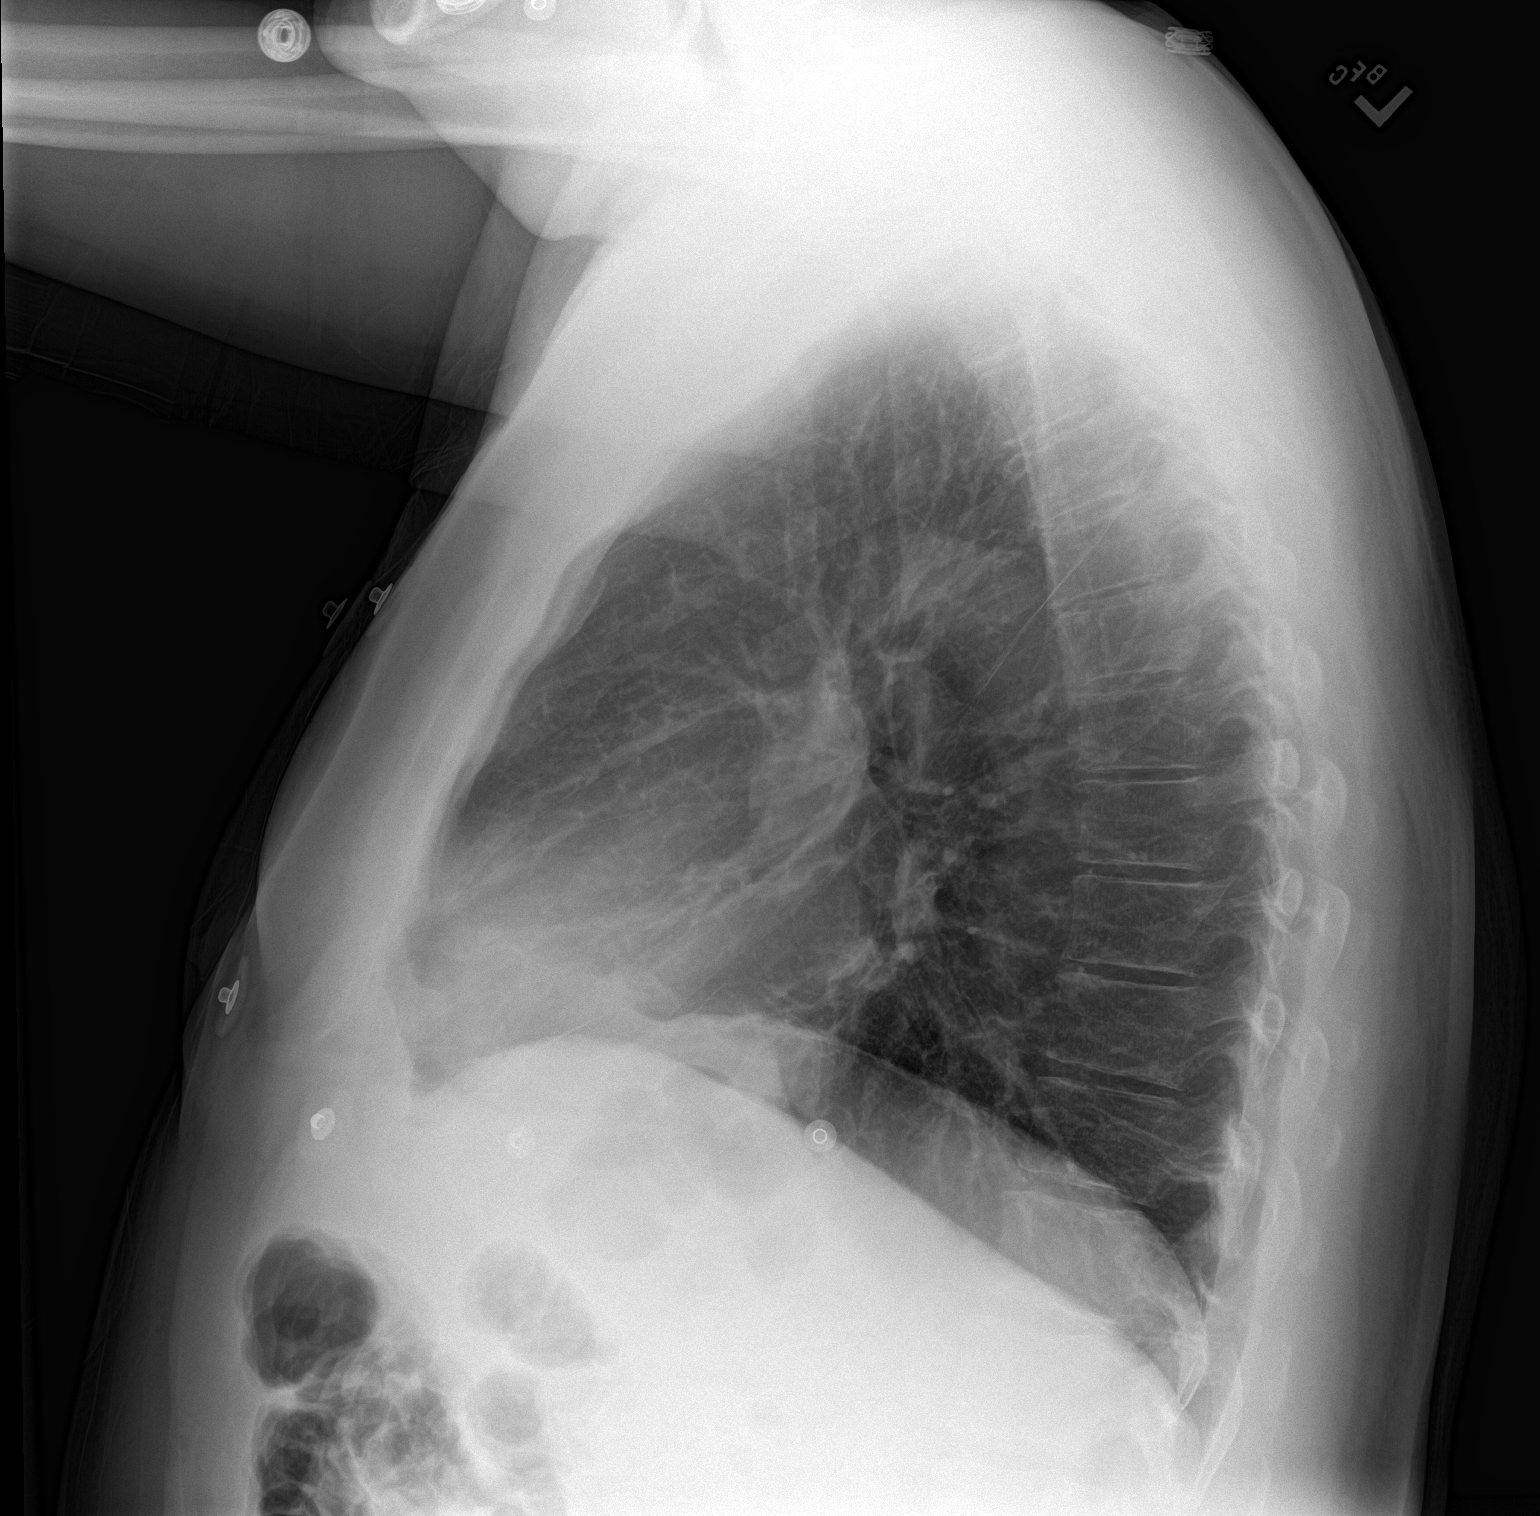

[2 of 2 positions shown; findings below may reference images not displayed]

FINDINGS: Normal heart size. No pleural effusion or edema. No airspace
consolidation noted. Scar versus subsegmental atelectasis noted in
the left base. Right lung is clear.
IMPRESSION: 1. Left base scar versus subsegmental atelectasis.

## 2017-06-09 DIAGNOSIS — M7701 Medial epicondylitis, right elbow: Secondary | ICD-10-CM | POA: Diagnosis not present

## 2017-07-23 DIAGNOSIS — H2513 Age-related nuclear cataract, bilateral: Secondary | ICD-10-CM | POA: Diagnosis not present

## 2017-07-23 DIAGNOSIS — H31092 Other chorioretinal scars, left eye: Secondary | ICD-10-CM | POA: Diagnosis not present

## 2017-07-23 DIAGNOSIS — H35342 Macular cyst, hole, or pseudohole, left eye: Secondary | ICD-10-CM | POA: Diagnosis not present

## 2017-08-12 DIAGNOSIS — Z0000 Encounter for general adult medical examination without abnormal findings: Secondary | ICD-10-CM | POA: Diagnosis not present

## 2017-08-12 DIAGNOSIS — Z1389 Encounter for screening for other disorder: Secondary | ICD-10-CM | POA: Diagnosis not present

## 2017-08-12 DIAGNOSIS — K219 Gastro-esophageal reflux disease without esophagitis: Secondary | ICD-10-CM | POA: Diagnosis not present

## 2017-08-12 DIAGNOSIS — Z6827 Body mass index (BMI) 27.0-27.9, adult: Secondary | ICD-10-CM | POA: Diagnosis not present

## 2017-08-12 DIAGNOSIS — K4090 Unilateral inguinal hernia, without obstruction or gangrene, not specified as recurrent: Secondary | ICD-10-CM | POA: Diagnosis not present

## 2017-08-12 DIAGNOSIS — J309 Allergic rhinitis, unspecified: Secondary | ICD-10-CM | POA: Diagnosis not present

## 2017-08-12 DIAGNOSIS — E663 Overweight: Secondary | ICD-10-CM | POA: Diagnosis not present

## 2017-08-12 DIAGNOSIS — Z23 Encounter for immunization: Secondary | ICD-10-CM | POA: Diagnosis not present

## 2017-08-12 DIAGNOSIS — I10 Essential (primary) hypertension: Secondary | ICD-10-CM | POA: Diagnosis not present

## 2017-08-12 DIAGNOSIS — Z1159 Encounter for screening for other viral diseases: Secondary | ICD-10-CM | POA: Diagnosis not present

## 2017-09-06 DIAGNOSIS — K4090 Unilateral inguinal hernia, without obstruction or gangrene, not specified as recurrent: Secondary | ICD-10-CM | POA: Diagnosis not present

## 2018-05-11 DIAGNOSIS — M25511 Pain in right shoulder: Secondary | ICD-10-CM | POA: Diagnosis not present

## 2018-07-28 DIAGNOSIS — H31092 Other chorioretinal scars, left eye: Secondary | ICD-10-CM | POA: Diagnosis not present

## 2018-07-28 DIAGNOSIS — H2513 Age-related nuclear cataract, bilateral: Secondary | ICD-10-CM | POA: Diagnosis not present

## 2018-07-28 DIAGNOSIS — H35342 Macular cyst, hole, or pseudohole, left eye: Secondary | ICD-10-CM | POA: Diagnosis not present

## 2021-03-24 DIAGNOSIS — S060X1D Concussion with loss of consciousness of 30 minutes or less, subsequent encounter: Secondary | ICD-10-CM | POA: Diagnosis not present

## 2021-03-24 DIAGNOSIS — H903 Sensorineural hearing loss, bilateral: Secondary | ICD-10-CM | POA: Diagnosis not present

## 2021-03-24 DIAGNOSIS — Z77122 Contact with and (suspected) exposure to noise: Secondary | ICD-10-CM | POA: Diagnosis not present

## 2021-04-29 DIAGNOSIS — H04123 Dry eye syndrome of bilateral lacrimal glands: Secondary | ICD-10-CM | POA: Diagnosis not present

## 2021-04-29 DIAGNOSIS — H40012 Open angle with borderline findings, low risk, left eye: Secondary | ICD-10-CM | POA: Diagnosis not present

## 2021-04-29 DIAGNOSIS — H2513 Age-related nuclear cataract, bilateral: Secondary | ICD-10-CM | POA: Diagnosis not present

## 2021-04-29 DIAGNOSIS — H31012 Macula scars of posterior pole (postinflammatory) (post-traumatic), left eye: Secondary | ICD-10-CM | POA: Diagnosis not present

## 2022-01-21 DIAGNOSIS — I10 Essential (primary) hypertension: Secondary | ICD-10-CM | POA: Diagnosis not present

## 2022-01-21 DIAGNOSIS — Z0000 Encounter for general adult medical examination without abnormal findings: Secondary | ICD-10-CM | POA: Diagnosis not present

## 2022-01-21 DIAGNOSIS — Z125 Encounter for screening for malignant neoplasm of prostate: Secondary | ICD-10-CM | POA: Diagnosis not present

## 2022-01-21 DIAGNOSIS — E782 Mixed hyperlipidemia: Secondary | ICD-10-CM | POA: Diagnosis not present

## 2022-01-21 DIAGNOSIS — Z1211 Encounter for screening for malignant neoplasm of colon: Secondary | ICD-10-CM | POA: Diagnosis not present

## 2022-04-21 DIAGNOSIS — K921 Melena: Secondary | ICD-10-CM | POA: Diagnosis not present

## 2022-07-04 DIAGNOSIS — K921 Melena: Secondary | ICD-10-CM | POA: Diagnosis not present

## 2022-07-04 DIAGNOSIS — D122 Benign neoplasm of ascending colon: Secondary | ICD-10-CM | POA: Diagnosis not present

## 2022-10-03 ENCOUNTER — Other Ambulatory Visit: Payer: Self-pay

## 2022-10-03 ENCOUNTER — Encounter (HOSPITAL_COMMUNITY): Payer: Self-pay

## 2022-10-03 ENCOUNTER — Emergency Department (HOSPITAL_COMMUNITY)
Admission: EM | Admit: 2022-10-03 | Discharge: 2022-10-04 | Disposition: A | Discharge status: Home / Self Care | Payer: Medicare PPO | Attending: Emergency Medicine | Admitting: Emergency Medicine

## 2022-10-03 DIAGNOSIS — N200 Calculus of kidney: Secondary | ICD-10-CM | POA: Diagnosis not present

## 2022-10-03 DIAGNOSIS — R1030 Lower abdominal pain, unspecified: Secondary | ICD-10-CM | POA: Diagnosis present

## 2022-10-03 DIAGNOSIS — R197 Diarrhea, unspecified: Secondary | ICD-10-CM | POA: Diagnosis not present

## 2022-10-03 MED ORDER — ONDANSETRON 4 MG PO TBDP
4.0000 mg | ORAL_TABLET | Freq: Once | ORAL | Status: AC
  Administered 2022-10-04: 4 mg via ORAL
  Filled 2022-10-03: qty 1

## 2022-10-03 MED ORDER — OXYCODONE-ACETAMINOPHEN 5-325 MG PO TABS
1.0000 | ORAL_TABLET | Freq: Once | ORAL | Status: AC
  Administered 2022-10-04: 1 via ORAL
  Filled 2022-10-03: qty 1

## 2022-10-03 NOTE — ED Provider Triage Note (Signed)
  Emergency Medicine Provider Triage Evaluation Note  MRN:  [id number]  Arrival date & time: 10/03/22    Medically screening exam initiated at 11:31 PM.   CC:   Groin Pain   HPI:  Lucas Santiago is a 72 y.o. year-old male presents to the ED with chief complaint of left groin pain.  Radiates to the tip of his penis.  No scrotal or testicle pain.  No discharge.  No flank pain.  Recent diarrhea.  History provided by patient. ROS:  -As included in HPI PE:   Vitals:   10/03/22 2320  BP: (!) 185/101  Pulse: 73  Resp: 20  Temp: 97.7 F (36.5 C)  SpO2: 100%    Non-toxic appearing No respiratory distress Normal testes, no tender, circumcised MDM:  Left groin pain.  DDx hernia, diverticulitis, KS.  Torsion thought less likely, no testicle pain. I've ordered labs and imaging in triage to expedite lab/diagnostic workup.  Patient was informed that the remainder of the evaluation will be completed by another provider, this initial triage assessment does not replace that evaluation, and the importance of remaining in the ED until their evaluation is complete.    Montine Circle, PA-C 10/03/22 2334

## 2022-10-03 NOTE — ED Triage Notes (Addendum)
Pt arrives with [facility] c/o groin pain and diarrhea that started around 10:45 this evening. Patient states pain is 7/10; has hx of kidney stones; states he is unsure if he passed a stone and states and he's unsure if it feels the same way as when he passed a stone years ago. Pt also states he currently has a bleeding hemorrhoid.   EMS vitals:  BP 170/100 HR 70 98% O2 on RA

## 2022-10-04 ENCOUNTER — Emergency Department (HOSPITAL_COMMUNITY): Payer: Medicare PPO

## 2022-10-04 LAB — URINALYSIS, ROUTINE W REFLEX MICROSCOPIC
Bacteria, UA: NONE SEEN
Bilirubin Urine: NEGATIVE
Glucose, UA: NEGATIVE mg/dL
Ketones, ur: 5 mg/dL — AB
Leukocytes,Ua: NEGATIVE
Nitrite: NEGATIVE
Protein, ur: NEGATIVE mg/dL
RBC / HPF: >50 RBC/hpf (ref 0–5)
Specific Gravity, Urine: >1.046 — ABNORMAL HIGH (ref 1.005–1.030)
pH: 5 (ref 5.0–8.0)

## 2022-10-04 LAB — COMPREHENSIVE METABOLIC PANEL
ALT: 22 U/L (ref 0–44)
AST: 25 U/L (ref 15–41)
Albumin: 4 g/dL (ref 3.5–5.0)
Alkaline Phosphatase: 65 U/L (ref 38–126)
Anion gap: 6 (ref 5–15)
BUN: 11 mg/dL (ref 8–23)
CO2: 28 mmol/L (ref 22–32)
Calcium: 9 mg/dL (ref 8.9–10.3)
Chloride: 104 mmol/L (ref 98–111)
Creatinine, Ser: 1.17 mg/dL (ref 0.61–1.24)
GFR, Estimated: >60 mL/min (ref >60)
Glucose, Bld: 129 mg/dL — ABNORMAL HIGH (ref 70–99)
Potassium: 3.8 mmol/L (ref 3.5–5.1)
Sodium: 138 mmol/L (ref 135–145)
Total Bilirubin: 0.7 mg/dL (ref 0.3–1.2)
Total Protein: 7.1 g/dL (ref 6.5–8.1)

## 2022-10-04 LAB — CBC WITH DIFFERENTIAL/PLATELET
Abs Immature Granulocytes: 0.04 10*3/uL (ref 0.00–0.07)
Basophils Absolute: 0.1 10*3/uL (ref 0.0–0.1)
Basophils Relative: 1 %
Eosinophils Absolute: 0.2 10*3/uL (ref 0.0–0.5)
Eosinophils Relative: 2 %
HCT: 45.8 % (ref 39.0–52.0)
Hemoglobin: 15.4 g/dL (ref 13.0–17.0)
Immature Granulocytes: 0 %
Lymphocytes Relative: 12 %
Lymphs Abs: 1.1 10*3/uL (ref 0.7–4.0)
MCH: 28.9 pg (ref 26.0–34.0)
MCHC: 33.6 g/dL (ref 30.0–36.0)
MCV: 85.9 fL (ref 80.0–100.0)
Monocytes Absolute: 0.5 10*3/uL (ref 0.1–1.0)
Monocytes Relative: 5 %
Neutro Abs: 7.3 10*3/uL (ref 1.7–7.7)
Neutrophils Relative %: 80 %
Platelets: 185 10*3/uL (ref 150–400)
RBC: 5.33 MIL/uL (ref 4.22–5.81)
RDW: 13.8 % (ref 11.5–15.5)
WBC: 9.1 10*3/uL (ref 4.0–10.5)
nRBC: 0 % (ref 0.0–0.2)

## 2022-10-04 LAB — LIPASE, BLOOD: Lipase: 38 U/L (ref 11–51)

## 2022-10-04 MED ORDER — OXYCODONE-ACETAMINOPHEN 5-325 MG PO TABS
1.0000 | ORAL_TABLET | Freq: Once | ORAL | Status: AC
  Administered 2022-10-04: 1 via ORAL
  Filled 2022-10-04: qty 1

## 2022-10-04 MED ORDER — IOHEXOL 350 MG/ML SOLN
75.0000 mL | Freq: Once | INTRAVENOUS | Status: AC | PRN
  Administered 2022-10-04: 75 mL via INTRAVENOUS

## 2022-10-04 MED ORDER — TAMSULOSIN HCL 0.4 MG PO CAPS: 0.4000 mg | ORAL_CAPSULE | Freq: Two times a day (BID) | ORAL | 0 refills | Status: DC

## 2022-10-04 MED ORDER — OXYCODONE-ACETAMINOPHEN 5-325 MG PO TABS: 1.0000 | ORAL_TABLET | Freq: Four times a day (QID) | ORAL | 0 refills | Status: DC | PRN

## 2022-10-04 MED ORDER — ONDANSETRON 4 MG PO TBDP: 4.0000 mg | ORAL_TABLET | Freq: Three times a day (TID) | ORAL | 0 refills | Status: DC | PRN

## 2022-10-04 NOTE — ED Provider Notes (Signed)
[facility] Emergency Department Provider Note MRN:  [id number]  Arrival date & time: 10/04/22     Chief Complaint   Groin Pain   History of Present Illness   Lucas Santiago is a 72 y.o. year-old male presents to the ED with chief complaint of left flank pain and groin pain.  Radiates to the tip of his penis.  Onset tonight.  Had some diarrhea as well and has had some cold symptoms.  Denies fever.  Denies testicle pain.  History provided by patient.   Review of Systems  Pertinent positive and negative review of systems noted in HPI.    Physical Exam   Vitals:   10/04/22 0258 10/04/22 0511  BP: (!) 146/99   Pulse: 69   Resp: 16   Temp:  98.4 F (36.9 C)  SpO2: 96%     CONSTITUTIONAL:  well-appearing, NAD NEURO:  Alert and oriented x 3, CN 3-12 grossly intact EYES:  eyes equal and reactive ENT/NECK:  Supple, no stridor  CARDIO:  normal rate, appears well-perfused  PULM:  No respiratory distress,  GI/GU:  non-distended, RN chaperone present for exam, circumcised, testes descended bilaterally, non-tender, no masses, sores, or lesions, no discharge, no hernia MSK/SPINE:  No gross deformities, no edema, moves all extremities  SKIN:  no rash, atraumatic   *Additional and/or pertinent findings included in MDM below  Diagnostic and Interventional Summary    EKG Interpretation  Date/Time:    Ventricular Rate:    PR Interval:    QRS Duration:   QT Interval:    QTC Calculation:   R Axis:     Text Interpretation:         Labs Reviewed  COMPREHENSIVE METABOLIC PANEL - Abnormal; Notable for the following components:      Result Value   Glucose, Bld 129 (*)    All other components within normal limits  URINALYSIS, ROUTINE W REFLEX MICROSCOPIC - Abnormal; Notable for the following components:   APPearance HAZY (*)    Specific Gravity, Urine >1.046 (*)    Hgb urine dipstick LARGE (*)    Ketones, ur 5 (*)    All other components within  normal limits  LIPASE, BLOOD  CBC WITH DIFFERENTIAL/PLATELET    CT ABDOMEN PELVIS W CONTRAST  Final Result      Medications  oxyCODONE-acetaminophen (PERCOCET/ROXICET) 5-325 MG per tablet 1 tablet (1 tablet Oral Given 10/04/22 0000)  ondansetron (ZOFRAN-ODT) disintegrating tablet 4 mg (4 mg Oral Given 10/04/22 0000)  iohexol (OMNIPAQUE) 350 MG/ML injection 75 mL (75 mLs Intravenous Contrast Given 10/04/22 0113)     Procedures  /  Critical Care Procedures  ED Course and Medical Decision Making  I have reviewed the triage vital signs, the nursing notes, and pertinent available records from the EMR.  Social Determinants Affecting Complexity of Care: Patient has no clinically significant social determinants affecting this chief complaint..   ED Course:    Medical Decision Making Patient here with left flank pain.  Suspicious for kidney stone.  Will check labs and CT.  PO percocet given for pain while waiting for room.  5:24 AM Reassessed.  Pain improved.  Ct shows stone in bladder.  VSS.  Appears stable for discharge.    Amount and/or Complexity of Data Reviewed Labs: ordered.    Details: Hematuria on UA Radiology: ordered and independent interpretation performed.    Details: Stone seen in bladder  Risk Prescription drug management.     Consultants: No consultations  were needed in caring for this patient.   Treatment and Plan: Emergency department workup does not suggest an emergent condition requiring admission or immediate intervention beyond  what has been performed at this time. The patient is safe for discharge and has  been instructed to return immediately for worsening symptoms, change in  symptoms or any other concerns    Final Clinical Impressions(s) / ED Diagnoses     ICD-10-CM   1. Kidney stone  N20.0       ED Discharge Orders          Ordered    oxyCODONE-acetaminophen (PERCOCET) 5-325 MG tablet  Every 6 hours PRN        10/04/22 0519     ondansetron (ZOFRAN-ODT) 4 MG disintegrating tablet  Every 8 hours PRN        10/04/22 0519    tamsulosin (FLOMAX) 0.4 MG CAPS capsule  2 times daily        10/04/22 [id number]              Discharge Instructions Discussed with and Provided to Patient:   Discharge Instructions   None      Montine Circle, PA-C [id number]    Delora Fuel, MD [id number] [phone]

## 2023-01-27 DIAGNOSIS — Z125 Encounter for screening for malignant neoplasm of prostate: Secondary | ICD-10-CM | POA: Diagnosis not present

## 2023-01-27 DIAGNOSIS — E782 Mixed hyperlipidemia: Secondary | ICD-10-CM | POA: Diagnosis not present

## 2023-01-27 DIAGNOSIS — Z0000 Encounter for general adult medical examination without abnormal findings: Secondary | ICD-10-CM | POA: Diagnosis not present

## 2023-04-15 DIAGNOSIS — H40012 Open angle with borderline findings, low risk, left eye: Secondary | ICD-10-CM | POA: Diagnosis not present

## 2023-04-15 DIAGNOSIS — H04123 Dry eye syndrome of bilateral lacrimal glands: Secondary | ICD-10-CM | POA: Diagnosis not present

## 2023-04-15 DIAGNOSIS — H31012 Macula scars of posterior pole (postinflammatory) (post-traumatic), left eye: Secondary | ICD-10-CM | POA: Diagnosis not present

## 2023-04-15 DIAGNOSIS — H2513 Age-related nuclear cataract, bilateral: Secondary | ICD-10-CM | POA: Diagnosis not present

## 2023-05-05 DIAGNOSIS — E782 Mixed hyperlipidemia: Secondary | ICD-10-CM | POA: Diagnosis not present

## 2023-05-17 DIAGNOSIS — H6123 Impacted cerumen, bilateral: Secondary | ICD-10-CM | POA: Diagnosis not present

## 2023-05-17 DIAGNOSIS — H66002 Acute suppurative otitis media without spontaneous rupture of ear drum, left ear: Secondary | ICD-10-CM | POA: Diagnosis not present

## 2023-05-17 DIAGNOSIS — R030 Elevated blood-pressure reading, without diagnosis of hypertension: Secondary | ICD-10-CM | POA: Diagnosis not present

## 2023-05-17 DIAGNOSIS — R0789 Other chest pain: Secondary | ICD-10-CM | POA: Diagnosis not present

## 2023-05-17 DIAGNOSIS — H6501 Acute serous otitis media, right ear: Secondary | ICD-10-CM | POA: Diagnosis not present

## 2023-07-07 DIAGNOSIS — Z96651 Presence of right artificial knee joint: Secondary | ICD-10-CM | POA: Diagnosis not present

## 2023-07-07 DIAGNOSIS — M25562 Pain in left knee: Secondary | ICD-10-CM | POA: Diagnosis not present

## 2023-07-07 DIAGNOSIS — M1712 Unilateral primary osteoarthritis, left knee: Secondary | ICD-10-CM | POA: Diagnosis not present

## 2023-11-11 DIAGNOSIS — K649 Unspecified hemorrhoids: Secondary | ICD-10-CM | POA: Diagnosis not present

## 2023-11-11 DIAGNOSIS — K5900 Constipation, unspecified: Secondary | ICD-10-CM | POA: Diagnosis not present

## 2023-12-16 DIAGNOSIS — K648 Other hemorrhoids: Secondary | ICD-10-CM | POA: Diagnosis not present

## 2023-12-22 DIAGNOSIS — H6123 Impacted cerumen, bilateral: Secondary | ICD-10-CM | POA: Diagnosis not present

## 2023-12-22 DIAGNOSIS — I10 Essential (primary) hypertension: Secondary | ICD-10-CM | POA: Diagnosis not present

## 2024-02-03 DIAGNOSIS — M170 Bilateral primary osteoarthritis of knee: Secondary | ICD-10-CM | POA: Diagnosis not present

## 2024-02-10 DIAGNOSIS — E782 Mixed hyperlipidemia: Secondary | ICD-10-CM | POA: Diagnosis not present

## 2024-02-10 DIAGNOSIS — Z0000 Encounter for general adult medical examination without abnormal findings: Secondary | ICD-10-CM | POA: Diagnosis not present

## 2024-02-10 DIAGNOSIS — Z125 Encounter for screening for malignant neoplasm of prostate: Secondary | ICD-10-CM | POA: Diagnosis not present

## 2024-02-24 DIAGNOSIS — S30860A Insect bite (nonvenomous) of lower back and pelvis, initial encounter: Secondary | ICD-10-CM | POA: Diagnosis not present

## 2024-06-12 DIAGNOSIS — M25562 Pain in left knee: Secondary | ICD-10-CM | POA: Diagnosis not present

## 2024-06-12 DIAGNOSIS — Z01818 Encounter for other preprocedural examination: Secondary | ICD-10-CM | POA: Diagnosis not present

## 2024-06-12 DIAGNOSIS — I10 Essential (primary) hypertension: Secondary | ICD-10-CM | POA: Diagnosis not present

## 2024-07-12 DIAGNOSIS — I10 Essential (primary) hypertension: Secondary | ICD-10-CM | POA: Diagnosis not present

## 2024-08-16 NOTE — Progress Notes (Addendum)
 Date of COVID positive in last 90 days:  PCP - Dr. Ricky Cardiologist -   Chest x-ray - N/A EKG - 08/22/24 Epic/chart Stress Test - N/A ECHO - N/A Cardiac Cath - N/A Pacemaker/ICD device last checked:N/A Spinal Cord Stimulator:N/A  Bowel Prep - N/A  Sleep Study - N/A CPAP -   Fasting Blood Sugar - N/A Checks Blood Sugar _____ times a day  Last dose of GLP1 agonist-  N/A GLP1 instructions:  Do not take after     Last dose of SGLT-2 inhibitors-  N/A SGLT-2 instructions:  Do not take after     Blood Thinner Instructions: N/A Last dose:   Time: Aspirin  Instructions:N/A Last Dose:  Activity level: Can go up a flight of stairs and perform activities of daily living without stopping and without symptoms of chest pain or shortness of breath.   Anesthesia review: N/A  Patient denies shortness of breath, fever, cough and chest pain at PAT appointment  Patient verbalized understanding of instructions that were given to them at the PAT appointment. Patient was also instructed that they will need to review over the PAT instructions again at home before surgery.

## 2024-08-16 NOTE — Patient Instructions (Addendum)
 SURGICAL WAITING ROOM VISITATION  Patients having surgery or a procedure may have no more than 2 support people in the waiting area - these visitors may rotate.    Children ages 65 and under will not be able to visit patients in [facility] under most circumstances.   Visitors with respiratory illnesses are discouraged from visiting and should remain at home.  If the patient needs to stay at the hospital during part of their recovery, the visitor guidelines for inpatient rooms apply. Pre-op nurse will coordinate an appropriate time for 1 support person to accompany patient in pre-op.  This support person may not rotate.    Please refer to the [facility] website for the visitor guidelines for Inpatients (after your surgery is over and you are in a regular room).    Your procedure is scheduled on: 08/29/24   Report to [facility] Main Entrance    Report to admitting at 5:15 AM   Call this number if you have problems the morning of surgery [phone]   Do not eat food :After Midnight.   After Midnight you may have the following liquids until 4:15 AM DAY OF SURGERY  Water Non-Citrus Juices (without pulp, NO RED-Apple, White grape, White cranberry) Black Coffee (NO MILK/CREAM OR CREAMERS, sugar ok)  Clear Tea (NO MILK/CREAM OR CREAMERS, sugar ok) regular and decaf                             Plain Jell-O (NO RED)                                           Fruit ices (not with fruit pulp, NO RED)                                     Popsicles (NO RED)                                                               Sports drinks like Gatorade (NO RED)                  The day of surgery:  Drink ONE (1) Pre-Surgery Clear Ensure at 4:15 AM the morning of surgery. Drink in one sitting. Do not sip.  This drink was given to you during your hospital  pre-op appointment visit. Nothing else to drink after completing the  Pre-Surgery Clear Ensure.          If you have  questions, please contact your surgeons office.   FOLLOW BOWEL PREP AND ANY ADDITIONAL PRE OP INSTRUCTIONS YOU RECEIVED FROM YOUR SURGEON'S OFFICE!!!     Oral Hygiene is also important to reduce your risk of infection.                                    Remember - BRUSH YOUR TEETH THE MORNING OF SURGERY WITH YOUR REGULAR TOOTHPASTE  DENTURES WILL BE REMOVED PRIOR TO SURGERY PLEASE DO NOT  APPLY Poly grip OR ADHESIVES!!!   Stop all vitamins and herbal supplements 7 days before surgery.   Take these medicines the morning of surgery with A SIP OF WATER: Zetia             You may not have any metal on your body including jewelry, and body piercing             Do not wear lotions, powders, cologne, or deodorant              Men may shave face and neck.   Do not bring valuables to the hospital. [facility] IS NOT             RESPONSIBLE   FOR VALUABLES.   Contacts, glasses, dentures or bridgework may not be worn into surgery.  DO NOT BRING YOUR HOME MEDICATIONS TO THE HOSPITAL. PHARMACY WILL DISPENSE MEDICATIONS LISTED ON YOUR MEDICATION LIST TO YOU DURING YOUR ADMISSION IN THE HOSPITAL!    Patients discharged on the day of surgery will not be allowed to drive home.  Someone NEEDS to stay with you for the first 24 hours after anesthesia.   Special Instructions: Bring a copy of your healthcare power of attorney and living will documents the day of surgery if you haven't scanned them before.              Please read over the following fact sheets you were given: IF YOU HAVE QUESTIONS ABOUT YOUR PRE-OP INSTRUCTIONS PLEASE CALL [phone].   If you received a COVID test during your pre-op visit  it is requested that you wear a mask when out in public, stay away from anyone that may not be feeling well and notify your surgeon if you develop symptoms. If you test positive for Covid or have been in contact with anyone that has tested positive in the last 10 days please notify you  surgeon.      Pre-operative 4 CHG Bath Instructions  DYNA-Hex 4 Chlorhexidine Gluconate 4% Solution Antiseptic 4 fl. oz   You can play a key role in reducing the risk of infection after surgery. Your skin needs to be as free of germs as possible. You can reduce the number of germs on your skin by washing with CHG (chlorhexidine gluconate) soap before surgery. CHG is an antiseptic soap that kills germs and continues to kill germs even after washing.   DO NOT use if you have an allergy to chlorhexidine/CHG or antibacterial soaps. If your skin becomes reddened or irritated, stop using the CHG and notify one of our RNs at   Please shower with the CHG soap starting 4 days before surgery using the following schedule:     Please keep in mind the following:  DO NOT shave, including legs and underarms, starting the day of your first shower.   You may shave your face at any point before/day of surgery.  Place clean sheets on your bed the day you start using CHG soap. Use a clean washcloth (not used since being washed) for each shower. DO NOT sleep with pets once you start using the CHG.  CHG Shower Instructions:  If you choose to wash your hair and private area, wash first with your normal shampoo/soap.  After you use shampoo/soap, rinse your hair and body thoroughly to remove shampoo/soap residue.  Turn the water OFF and apply about 3 tablespoons (45 ml) of CHG soap to a CLEAN washcloth.  Apply CHG soap ONLY FROM YOUR  NECK DOWN TO YOUR TOES (washing for 3-5 minutes)  DO NOT use CHG soap on face, private areas, open wounds, or sores.  Pay special attention to the area where your surgery is being performed.  If you are having back surgery, having someone wash your back for you may be helpful. Wait 2 minutes after CHG soap is applied, then you may rinse off the CHG soap.  Pat dry with a clean towel  Put on clean clothes/pajamas   If you choose to wear lotion, please use ONLY the CHG-compatible  lotions on the back of this paper.     Additional instructions for the day of surgery: DO NOT APPLY any lotions, deodorants, cologne, or perfumes.   Put on clean/comfortable clothes.  Brush your teeth.  Ask your nurse before applying any prescription medications to the skin.   CHG Compatible Lotions   Aveeno Moisturizing lotion  Cetaphil Moisturizing Cream  Cetaphil Moisturizing Lotion  Clairol Herbal Essence Moisturizing Lotion, Dry Skin  Clairol Herbal Essence Moisturizing Lotion, Extra Dry Skin  Clairol Herbal Essence Moisturizing Lotion, Normal Skin  Curel Age Defying Therapeutic Moisturizing Lotion with Alpha Hydroxy  Curel Extreme Care Body Lotion  Curel Soothing Hands Moisturizing Hand Lotion  Curel Therapeutic Moisturizing Cream, Fragrance-Free  Curel Therapeutic Moisturizing Lotion, Fragrance-Free  Curel Therapeutic Moisturizing Lotion, Original Formula  Eucerin Daily Replenishing Lotion  Eucerin Dry Skin Therapy Plus Alpha Hydroxy Crme  Eucerin Dry Skin Therapy Plus Alpha Hydroxy Lotion  Eucerin Original Crme  Eucerin Original Lotion  Eucerin Plus Crme Eucerin Plus Lotion  Eucerin TriLipid Replenishing Lotion  Keri Anti-Bacterial Hand Lotion  Keri Deep Conditioning Original Lotion Dry Skin Formula Softly Scented  Keri Deep Conditioning Original Lotion, Fragrance Free Sensitive Skin Formula  Keri Lotion Fast Absorbing Fragrance Free Sensitive Skin Formula  Keri Lotion Fast Absorbing Softly Scented Dry Skin Formula  Keri Original Lotion  Keri Skin Renewal Lotion Keri Silky Smooth Lotion  Keri Silky Smooth Sensitive Skin Lotion  Nivea Body Creamy Conditioning Oil  Nivea Body Extra Enriched Lotion  Nivea Body Original Lotion  Nivea Body Sheer Moisturizing Lotion Nivea Crme  Nivea Skin Firming Lotion  NutraDerm 30 Skin Lotion  NutraDerm Skin Lotion  NutraDerm Therapeutic Skin Cream  NutraDerm Therapeutic Skin Lotion  ProShield Protective Hand Cream  Provon  moisturizing lotion  Incentive Spirometer  An incentive spirometer is a tool that can help keep your lungs clear and active. This tool measures how well you are filling your lungs with each breath. Taking long deep breaths may help reverse or decrease the chance of developing breathing (pulmonary) problems (especially infection) following: A long period of time when you are unable to move or be active. BEFORE THE PROCEDURE  If the spirometer includes an indicator to show your best effort, your nurse or respiratory therapist will set it to a desired goal. If possible, sit up straight or lean slightly forward. Try not to slouch. Hold the incentive spirometer in an upright position. INSTRUCTIONS FOR USE  Sit on the edge of your bed if possible, or sit up as far as you can in bed or on a chair. Hold the incentive spirometer in an upright position. Breathe out normally. Place the mouthpiece in your mouth and seal your lips tightly around it. Breathe in slowly and as deeply as possible, raising the piston or the ball toward the top of the column. Hold your breath for 3-5 seconds or for as long as possible. Allow the piston or ball to  fall to the bottom of the column. Remove the mouthpiece from your mouth and breathe out normally. Rest for a few seconds and repeat Steps 1 through 7 at least 10 times every 1-2 hours when you are awake. Take your time and take a few normal breaths between deep breaths. The spirometer may include an indicator to show your best effort. Use the indicator as a goal to work toward during each repetition. After each set of 10 deep breaths, practice coughing to be sure your lungs are clear. If you have an incision (the cut made at the time of surgery), support your incision when coughing by placing a pillow or rolled up towels firmly against it. Once you are able to get out of bed, walk around indoors and cough well. You may stop using the incentive spirometer when instructed  by your caregiver.  RISKS AND COMPLICATIONS Take your time so you do not get dizzy or light-headed. If you are in pain, you may need to take or ask for pain medication before doing incentive spirometry. It is harder to take a deep breath if you are having pain. AFTER USE Rest and breathe slowly and easily. It can be helpful to keep track of a log of your progress. Your caregiver can provide you with a simple table to help with this. If you are using the spirometer at home, follow these instructions: SEEK MEDICAL CARE IF:  You are having difficultly using the spirometer. You have trouble using the spirometer as often as instructed. Your pain medication is not giving enough relief while using the spirometer. You develop fever of 100.5 F (38.1 C) or higher. SEEK IMMEDIATE MEDICAL CARE IF:  You cough up bloody sputum that had not been present before. You develop fever of 102 F (38.9 C) or greater. You develop worsening pain at or near the incision site. MAKE SURE YOU:  Understand these instructions. Will watch your condition. Will get help right away if you are not doing well or get worse. Document Released: 12/21/2006 Document Revised: 11/02/2011 Document Reviewed: 02/21/2007 [facility] Patient Information 2014 [place], [place].   ________________________________________________________________________

## 2024-08-20 NOTE — H&P (Signed)
 PARTIAL KNEE ADMISSION H&P  Patient is being admitted for left partial knee arthroplasty.  Therapy Plans: outpatient therapy at [facility] Disposition: Home with wife Planned DVT Prophylaxis: aspirin  81mg  BID DME needed: none PCP: Dr. 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  HPI: Lucas Santiago, 73 y.o. male, has a history of pain and functional disability in the left knee due to arthritis and has failed non-surgical conservative treatments for greater than 12 weeks to includeNSAID's and/or analgesics, corticosteriod injections, and activity modification.  Onset of symptoms was gradual, starting 2 years ago with gradually worsening course since that time. The patient noted no past surgery on the left knee(s).  Patient currently rates pain in the left knee(s) at 8 out of 10 with activity. Patient has worsening of pain with activity and weight bearing and pain that interferes with activities of daily living.  Patient has evidence of joint space narrowing by imaging studies. There is no active infection.  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   Current Outpatient Medications  Medication Sig Dispense Refill Last Dose/Taking   calcium carbonate (TUMS - DOSED IN MG ELEMENTAL CALCIUM) 500 MG chewable tablet Chew 1 tablet by mouth daily as needed for indigestion or heartburn.   Taking As Needed   ezetimibe (ZETIA) 10 MG tablet Take 10 mg by mouth in the morning.   Taking   hydrochlorothiazide (HYDRODIURIL) 25 MG tablet Take 25 mg by mouth in the morning.   Taking   Allergies[1]  Social History   Tobacco Use   Smoking status: Never   Smokeless tobacco: Never  Substance Use Topics   Alcohol use: Yes    Comment: occasional    No family history on file.   Review of Systems  Constitutional:  Negative for chills and fever.  Respiratory:  Negative for cough and shortness of breath.   Cardiovascular:  Negative for chest pain.  Gastrointestinal:  Negative for nausea and vomiting.  Musculoskeletal:  Positive for arthralgias.     Objective:  Physical Exam Left knee exam: No palpable effusion, warmth erythema Slight genu varum with tenderness medially Slight flexion contracture with flexion to about 120 degrees with terminal tightness Right knee exam: The surgical incision remains well-healed Full knee extension and flexion over 110 degrees  Vital signs in last 24 hours: BP: ()/()  Arterial Line BP: ()/()   Labs:  Estimated body mass index is 27.37 kg/m as calculated from the following:   Height as of 10/03/22: 5' 8 (1.727 m).   Weight as of 10/03/22: 81.6 kg.   Imaging Review Plain radiographs demonstrate severe degenerative joint disease of the left knee(s).  The bone quality appears to be adequate for age and reported activity level.      Assessment/Plan:  End stage arthritis, left knee   The patient history, physical examination, clinical judgment  of the provider and imaging studies are consistent with end stage degenerative joint disease of the left knee(s) and partial  knee arthroplasty is deemed medically necessary. The treatment options including medical management, injection therapy arthroscopy and arthroplasty were discussed at length. The risks and benefits of partial knee arthroplasty were presented and reviewed. The risks due to aseptic loosening, infection, stiffness, patella tracking problems, thromboembolic complications and other imponderables were discussed. The patient acknowledged the explanation, agreed to proceed with the plan and consent was signed. Patient is being admitted for inpatient treatment for surgery, pain control, PT, OT, prophylactic antibiotics, VTE prophylaxis, progressive ambulation and ADL's and discharge planning. The patient is planning to be discharged home.    Rosina Calin, PA-C Orthopedic Surgery [facility] [place] [phone]      [1] No Known Allergies

## 2024-08-22 ENCOUNTER — Encounter (HOSPITAL_COMMUNITY): Payer: Self-pay

## 2024-08-22 ENCOUNTER — Encounter (HOSPITAL_COMMUNITY)
Admission: RE | Admit: 2024-08-22 | Discharge: 2024-08-22 | Disposition: A | Discharge status: Home / Self Care | Source: Ambulatory Visit | Attending: Orthopedic Surgery | Admitting: Orthopedic Surgery

## 2024-08-22 ENCOUNTER — Other Ambulatory Visit: Payer: Self-pay

## 2024-08-22 VITALS — BP 142/93 | HR 68 | Temp 98.4°F | Resp 16 | Ht 69.0 in | Wt 188.0 lb

## 2024-08-22 DIAGNOSIS — I10 Essential (primary) hypertension: Secondary | ICD-10-CM | POA: Insufficient documentation

## 2024-08-22 DIAGNOSIS — Z01818 Encounter for other preprocedural examination: Secondary | ICD-10-CM | POA: Diagnosis present

## 2024-08-22 HISTORY — DX: Pure hypercholesterolemia, unspecified: E78.00

## 2024-08-22 LAB — CBC
HCT: 46.3 % (ref 39.0–52.0)
Hemoglobin: 15.4 g/dL (ref 13.0–17.0)
MCH: 28.5 pg (ref 26.0–34.0)
MCHC: 33.3 g/dL (ref 30.0–36.0)
MCV: 85.7 fL (ref 80.0–100.0)
Platelets: 231 K/uL (ref 150–400)
RBC: 5.4 MIL/uL (ref 4.22–5.81)
RDW: 14.5 % (ref 11.5–15.5)
WBC: 7.4 K/uL (ref 4.0–10.5)
nRBC: 0 % (ref 0.0–0.2)

## 2024-08-22 LAB — BASIC METABOLIC PANEL WITH GFR
Anion gap: 11 (ref 5–15)
BUN: 15 mg/dL (ref 8–23)
CO2: 27 mmol/L (ref 22–32)
Calcium: 10 mg/dL (ref 8.9–10.3)
Chloride: 102 mmol/L (ref 98–111)
Creatinine, Ser: 1.08 mg/dL (ref 0.61–1.24)
GFR, Estimated: >60 mL/min
Glucose, Bld: 100 mg/dL — ABNORMAL HIGH (ref 70–99)
Potassium: 4 mmol/L (ref 3.5–5.1)
Sodium: 140 mmol/L (ref 135–145)

## 2024-08-22 LAB — SURGICAL PCR SCREEN
MRSA, PCR: NEGATIVE
Staphylococcus aureus: NEGATIVE

## 2024-08-28 NOTE — Anesthesia Preprocedure Evaluation (Addendum)
 "                                  Anesthesia Evaluation  Patient identified by MRN, date of birth, ID band Patient awake    Reviewed: Allergy & Precautions, NPO status , Patient's Chart, lab work & pertinent test results  History of Anesthesia Complications Negative for: history of anesthetic complications  Airway Mallampati: II  TM Distance: >3 FB Neck ROM: Full    Dental no notable dental hx. (+) Implants, Teeth Intact   Pulmonary    Pulmonary exam normal breath sounds clear to auscultation       Cardiovascular hypertension, Pt. on medications (-) angina (-) Past MI Normal cardiovascular exam Rhythm:Regular Rate:Normal     Neuro/Psych  PSYCHIATRIC DISORDERS Anxiety Depression    negative neurological ROS     GI/Hepatic Neg liver ROS,GERD  Medicated and Controlled,,  Endo/Other  negative endocrine ROS    Renal/GU          Lab Results      Component                Value               Date                                   K                        4.0                 08/22/2024                CREATININE               1.08                08/22/2024                    Musculoskeletal  (+) Arthritis ,    Abdominal   Peds  Hematology Lab Results      Component                Value               Date                      WBC                      7.4                 08/22/2024                HGB                      15.4                08/22/2024                HCT                      46.3                08/22/2024  MCV                      85.7                08/22/2024                PLT                      231                 08/22/2024              Anesthesia Other Findings [id number]  Reproductive/Obstetrics negative OB ROS                              Anesthesia Physical Anesthesia Plan  ASA: 2  Anesthesia Plan: Spinal and Regional   Post-op Pain Management: Regional block* and Toradol  IV (intra-op)*    Induction:   PONV Risk Score and Plan: 2 and Propofol  infusion, Treatment may vary due to age or medical condition and Midazolam   Airway Management Planned: Nasal Cannula and Natural Airway  Additional Equipment: None  Intra-op Plan:   Post-operative Plan:   Informed Consent:      Dental advisory given  Plan Discussed with:   Anesthesia Plan Comments: (Spinal w L adductor)         Anesthesia Quick Evaluation  "

## 2024-08-28 NOTE — H&P (Signed)
 PARTIAL KNEE ADMISSION H&P  Patient is being admitted for left partial knee arthroplasty.  Therapy Plans: outpatient therapy at [facility] Disposition: Home with wife Planned DVT Prophylaxis: aspirin  81mg  BID DME needed: none PCP: Dr. 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  HPI: Lucas Santiago, 74 y.o. male has a history of pain and functional disability in the left knee due to osteoarthritis and has failed non-surgical conservative treatments for greater than 12 weeks to include NSAID's and/or analgesics, corticosteriod injections, and activity modification. Onset of symptoms was gradual, starting 2 years ago with gradually worsening course since that time. The patient noted no past surgery on the left knee.  Patient currently rates pain in the left knee at 7 out of 10 with activity. Patient has worsening of pain with activity and weight bearing and pain that interferes with activities of daily living. Patient has evidence of joint space narrowing by imaging studies. There is no active infection.  Patient Active Problem List   Diagnosis Date Noted   Overweight (BMI 25.0-29.9) 02/21/2013   S/P right UKR 02/20/2013    Past Medical History:  Diagnosis Date   Allergy to trees    oak trees   Anxiety    Arthritis    knees   Depression    GERD (gastroesophageal reflux disease)    History of kidney stones 15 years ago   Hypercholesteremia    Hypertension    Pneumonia 08/24/2006   x2    Past Surgical History:  Procedure Laterality Date   COLONOSCOPY     PARTIAL KNEE ARTHROPLASTY Right 02/20/2013   Procedure: RIGHT KNEE MEDIAL  UNICOMPARTMENTAL ARTHROPLASTY;  Surgeon: Donnice JONETTA Car, MD;  Location: [facility];  Service: Orthopedics;  Laterality: Right;   TONSILLECTOMY  as child    Prior to Admission medications  Medication Sig Start Date End Date Taking? Authorizing Provider  calcium carbonate (TUMS - DOSED IN MG ELEMENTAL CALCIUM) 500 MG chewable tablet Chew 1 tablet by mouth daily as needed for indigestion or heartburn.   Yes [provider]  ezetimibe (ZETIA) 10 MG tablet Take 10 mg by mouth in the morning.   Yes [provider]  hydrochlorothiazide (HYDRODIURIL) 25 MG tablet Take 25 mg by mouth in the morning.   Yes [provider]    Allergies[1]  Social History   Socioeconomic History   Marital status: Married    Spouse name: Not on file   Number of children: Not on file   Years of education: Not on file   Highest education level: Not on file  Occupational History   Not on file  Tobacco Use   Smoking status: Never   Smokeless tobacco: Never  Vaping Use   Vaping status: Never Used  Substance and Sexual Activity   Alcohol use: Yes    Comment: occasional   Drug use: No   Sexual activity: Not on file  Other Topics Concern   Not on file  Social History Narrative   Not on file   Social Drivers of Health   Tobacco Use: Low Risk (08/22/2024)   Patient History    Smoking Tobacco Use:  Never    Smokeless Tobacco Use: Never    Passive Exposure: Not on file  Financial Resource Strain: Not on file  Food Insecurity: Low Risk (12/16/2023)   Received from [facility]   Epic    Within the past 12 months, the food you bought just didn't last and you didn't have money to get more. : Never true    Within the past 12 months, you worried that your food would run out before you got money to buy more: Never true  Transportation Needs: No Transportation Needs (12/16/2023)   Received from [facility]    In the past 12 months, has lack of reliable transportation kept  you from medical appointments, meetings, work or from getting things needed for daily living? : No  Physical Activity: Not on file  Stress: Not on file  Social Connections: Not on file  Intimate Partner Violence: Not on file  Depression (EYV7-0): Not on file  Alcohol Screen: Not on file  Housing: Low Risk (12/16/2023)   Received from [facility]   Epic    Think about the place you live. Do you have problems with any of the following? Choose all that apply:: Not on file    What is your living situation today?: I have a steady place to live  Utilities: Low Risk (12/16/2023)   Received from [facility]   Utilities    In the past 12 months has the electric, gas, oil, or water  company threatened to shut off services in your home? : No  Health Literacy: Not on file    Tobacco Use: Low Risk (08/22/2024)   Patient History    Smoking Tobacco Use: Never    Smokeless Tobacco Use: Never    Passive Exposure: Not on file   Social History   Substance and Sexual Activity  Alcohol Use Yes   Comment: occasional    No family history on file.  Review Of Systems: Constitutional: Constitutional: no fever, chills, night sweats, or significant weight loss. Cardiovascular: Cardiovascular: no palpitations or chest pain. Respiratory: Respiratory: no cough or shortness of breath and No COPD. Gastrointestinal: Gastrointestinal: no vomiting or nausea. Musculoskeletal: Musculoskeletal: Joint Pain and swelling in Joints. Neurologic: Neurologic: no numbness, tingling, or difficulty with balance.  Objective:  Physical Exam: Left knee exam: No palpable effusion, warmth erythema Slight genu varum with tenderness medially Slight flexion contracture with flexion to about 120 degrees with terminal tightness Right knee exam: The surgical incision remains well-healed Full knee extension and flexion over 110 degrees  Vital signs in last 24 hours:    Imaging Review Plain radiographs demonstrate  severe degenerative joint disease of the medial compartment of the left knee.  The bone quality appears to be adequate for age and reported activity level.  Assessment/Plan:  End stage arthritis, left knee   The patient history, physical examination, clinical judgment of the provider and imaging studies are consistent with end stage degenerative joint disease of the left knee and partial knee arthroplasty is deemed medically necessary. The treatment options including medical management, injection therapy arthroscopy and arthroplasty were discussed at length. The risks and benefits of total knee arthroplasty were presented and reviewed. The risks due to aseptic loosening, infection, stiffness, patella tracking problems, thromboembolic complications and other imponderables were discussed. The patient acknowledged the explanation, agreed to proceed with the plan and consent was signed. Patient is being admitted for inpatient treatment for surgery, pain control, PT, OT, prophylactic antibiotics, VTE prophylaxis, progressive ambulation  and ADLs and discharge planning. The patient is planning to be discharged home.   Patient's anticipated LOS is less than 2 midnights, meeting these requirements: - Younger than 72 - Lives within 1 hour of care - Has a competent adult at home to recover with post-op recover - NO history of  - Chronic pain requiring opiods  - Diabetes  - Coronary Artery Disease  - Heart failure  - Heart attack  - Stroke  - DVT/VTE  - Cardiac arrhythmia  - Respiratory Failure/COPD  - Renal failure  - Anemia  - Advanced Liver disease    Rosina Calin, PA-C Orthopedic Surgery [facility] [place] [phone]      [1] No Known Allergies

## 2024-08-29 ENCOUNTER — Encounter (HOSPITAL_COMMUNITY): Payer: Self-pay | Admitting: Orthopedic Surgery

## 2024-08-29 ENCOUNTER — Ambulatory Visit (HOSPITAL_COMMUNITY): Admitting: Anesthesiology

## 2024-08-29 ENCOUNTER — Ambulatory Visit (HOSPITAL_COMMUNITY)
Admission: RE | Admit: 2024-08-29 | Discharge: 2024-08-29 | Disposition: A | Discharge status: Home / Self Care | Attending: Orthopedic Surgery | Admitting: Orthopedic Surgery

## 2024-08-29 ENCOUNTER — Encounter (HOSPITAL_COMMUNITY)
Admission: RE | Disposition: A | Discharge status: Home / Self Care | Payer: Self-pay | Source: Home / Self Care | Attending: Orthopedic Surgery

## 2024-08-29 DIAGNOSIS — M170 Bilateral primary osteoarthritis of knee: Secondary | ICD-10-CM | POA: Diagnosis not present

## 2024-08-29 DIAGNOSIS — Z96652 Presence of left artificial knee joint: Secondary | ICD-10-CM

## 2024-08-29 DIAGNOSIS — K219 Gastro-esophageal reflux disease without esophagitis: Secondary | ICD-10-CM | POA: Insufficient documentation

## 2024-08-29 DIAGNOSIS — M1712 Unilateral primary osteoarthritis, left knee: Secondary | ICD-10-CM

## 2024-08-29 DIAGNOSIS — Z96651 Presence of right artificial knee joint: Secondary | ICD-10-CM | POA: Insufficient documentation

## 2024-08-29 DIAGNOSIS — I10 Essential (primary) hypertension: Secondary | ICD-10-CM | POA: Diagnosis not present

## 2024-08-29 HISTORY — PX: PARTIAL KNEE ARTHROPLASTY: SHX2174

## 2024-08-29 SURGERY — ARTHROPLASTY, KNEE, UNICOMPARTMENTAL
Anesthesia: Regional | Site: Knee | Laterality: Left

## 2024-08-29 MED ORDER — TRANEXAMIC ACID-NACL 1000-0.7 MG/100ML-% IV SOLN
1000.0000 mg | INTRAVENOUS | Status: AC
  Administered 2024-08-29: 1000 mg via INTRAVENOUS
  Filled 2024-08-29: qty 100

## 2024-08-29 MED ORDER — PROPOFOL 500 MG/50ML IV EMUL
INTRAVENOUS | Status: DC | PRN
  Administered 2024-08-29: 125 ug/kg/min via INTRAVENOUS

## 2024-08-29 MED ORDER — FENTANYL CITRATE (PF) 100 MCG/2ML IJ SOLN
INTRAMUSCULAR | Status: AC
  Filled 2024-08-29: qty 2

## 2024-08-29 MED ORDER — EPHEDRINE SULFATE (PRESSORS) 25 MG/5ML IV SOSY
PREFILLED_SYRINGE | INTRAVENOUS | Status: DC | PRN
  Administered 2024-08-29: 10 mg via INTRAVENOUS
  Administered 2024-08-29: 5 mg via INTRAVENOUS

## 2024-08-29 MED ORDER — PHENYLEPHRINE HCL-NACL 20-0.9 MG/250ML-% IV SOLN
INTRAVENOUS | Status: AC
  Filled 2024-08-29: qty 750

## 2024-08-29 MED ORDER — ROPIVACAINE HCL 5 MG/ML IJ SOLN
INTRAMUSCULAR | Status: DC | PRN
  Administered 2024-08-29: 25 mL via PERINEURAL

## 2024-08-29 MED ORDER — CLONIDINE HCL (ANALGESIA) 100 MCG/ML EP SOLN
EPIDURAL | Status: DC | PRN
  Administered 2024-08-29: 100 ug

## 2024-08-29 MED ORDER — LACTATED RINGERS IV SOLN: INTRAVENOUS | Status: DC | PRN

## 2024-08-29 MED ORDER — CEFAZOLIN SODIUM-DEXTROSE 2-4 GM/100ML-% IV SOLN
2.0000 g | INTRAVENOUS | Status: DC
  Filled 2024-08-29: qty 100

## 2024-08-29 MED ORDER — MIDAZOLAM HCL 2 MG/2ML IJ SOLN
INTRAMUSCULAR | Status: AC
  Filled 2024-08-29: qty 2

## 2024-08-29 MED ORDER — MIDAZOLAM HCL (PF) 2 MG/2ML IJ SOLN
INTRAMUSCULAR | Status: DC | PRN
  Administered 2024-08-29: 2 mg via INTRAVENOUS

## 2024-08-29 MED ORDER — LACTATED RINGERS IV SOLN: INTRAVENOUS | Status: DC

## 2024-08-29 MED ORDER — TRANEXAMIC ACID-NACL 1000-0.7 MG/100ML-% IV SOLN: 1000.0000 mg | Freq: Once | INTRAVENOUS | Status: DC

## 2024-08-29 MED ORDER — ONDANSETRON HCL 4 MG/2ML IJ SOLN
INTRAMUSCULAR | Status: AC
  Filled 2024-08-29: qty 2

## 2024-08-29 MED ORDER — METHOCARBAMOL 1000 MG/10ML IJ SOLN: 500.0000 mg | Freq: Four times a day (QID) | INTRAMUSCULAR | Status: DC | PRN

## 2024-08-29 MED ORDER — CEFAZOLIN SODIUM-DEXTROSE 2-3 GM-%(50ML) IV SOLR
INTRAVENOUS | Status: DC | PRN
  Administered 2024-08-29: 2 g via INTRAVENOUS

## 2024-08-29 MED ORDER — FENTANYL CITRATE (PF) 50 MCG/ML IJ SOSY: 25.0000 ug | PREFILLED_SYRINGE | INTRAMUSCULAR | Status: DC | PRN

## 2024-08-29 MED ORDER — PHENYLEPHRINE HCL-NACL 20-0.9 MG/250ML-% IV SOLN
INTRAVENOUS | Status: DC | PRN
  Administered 2024-08-29: 40 ug/min via INTRAVENOUS

## 2024-08-29 MED ORDER — ACETAMINOPHEN 10 MG/ML IV SOLN: 1000.0000 mg | Freq: Once | INTRAVENOUS | Status: DC | PRN

## 2024-08-29 MED ORDER — STERILE WATER FOR IRRIGATION IR SOLN
Status: DC | PRN
  Administered 2024-08-29: 2000 mL

## 2024-08-29 MED ORDER — DEXMEDETOMIDINE HCL IN NACL 80 MCG/20ML IV SOLN: INTRAVENOUS | Status: DC | PRN

## 2024-08-29 MED ORDER — DEXAMETHASONE SOD PHOSPHATE PF 10 MG/ML IJ SOLN: 8.0000 mg | Freq: Once | INTRAMUSCULAR | Status: DC

## 2024-08-29 MED ORDER — DEXAMETHASONE SOD PHOSPHATE PF 10 MG/ML IJ SOLN
INTRAMUSCULAR | Status: DC | PRN
  Administered 2024-08-29: 10 mg via INTRAVENOUS

## 2024-08-29 MED ORDER — LACTATED RINGERS IV BOLUS
500.0000 mL | Freq: Once | INTRAVENOUS | Status: AC
  Administered 2024-08-29: 500 mL via INTRAVENOUS

## 2024-08-29 MED ORDER — ONDANSETRON HCL 4 MG/2ML IJ SOLN: 4.0000 mg | Freq: Once | INTRAMUSCULAR | Status: DC | PRN

## 2024-08-29 MED ORDER — 0.9 % SODIUM CHLORIDE (POUR BTL) OPTIME
TOPICAL | Status: DC | PRN
  Administered 2024-08-29: 1000 mL

## 2024-08-29 MED ORDER — POVIDONE-IODINE 10 % EX SWAB
2.0000 | Freq: Once | CUTANEOUS | Status: AC
  Administered 2024-08-29: 2 via TOPICAL

## 2024-08-29 MED ORDER — ORAL CARE MOUTH RINSE: 15.0000 mL | Freq: Once | OROMUCOSAL | Status: AC

## 2024-08-29 MED ORDER — SODIUM CHLORIDE (PF) 0.9 % IJ SOLN
INTRAMUSCULAR | Status: AC
  Filled 2024-08-29: qty 30

## 2024-08-29 MED ORDER — KETOROLAC TROMETHAMINE 30 MG/ML IJ SOLN
INTRAMUSCULAR | Status: AC
  Filled 2024-08-29: qty 1

## 2024-08-29 MED ORDER — FENTANYL CITRATE (PF) 100 MCG/2ML IJ SOLN
INTRAMUSCULAR | Status: DC | PRN
  Administered 2024-08-29 (×2): 50 ug via INTRAVENOUS

## 2024-08-29 MED ORDER — SODIUM CHLORIDE (PF) 0.9 % IJ SOLN
INTRAMUSCULAR | Status: DC | PRN
  Administered 2024-08-29: 61 mL

## 2024-08-29 MED ORDER — EPHEDRINE 5 MG/ML INJ
INTRAVENOUS | Status: AC
  Filled 2024-08-29: qty 5

## 2024-08-29 MED ORDER — CHLORHEXIDINE GLUCONATE 0.12 % MT SOLN
15.0000 mL | Freq: Once | OROMUCOSAL | Status: AC
  Administered 2024-08-29: 15 mL via OROMUCOSAL

## 2024-08-29 MED ORDER — CEFAZOLIN SODIUM-DEXTROSE 2-4 GM/100ML-% IV SOLN: 2.0000 g | Freq: Four times a day (QID) | INTRAVENOUS | Status: DC

## 2024-08-29 MED ORDER — METHOCARBAMOL 500 MG PO TABS: 500.0000 mg | ORAL_TABLET | Freq: Four times a day (QID) | ORAL | Status: DC | PRN

## 2024-08-29 MED ORDER — PHENYLEPHRINE HCL (PRESSORS) 10 MG/ML IV SOLN
INTRAVENOUS | Status: DC | PRN
  Administered 2024-08-29: 160 ug via INTRAVENOUS

## 2024-08-29 MED ORDER — ONDANSETRON HCL 4 MG/2ML IJ SOLN
INTRAMUSCULAR | Status: DC | PRN
  Administered 2024-08-29: 4 mg via INTRAVENOUS

## 2024-08-29 MED ORDER — MEPIVACAINE HCL (PF) 2 % IJ SOLN
INTRAMUSCULAR | Status: DC | PRN
  Administered 2024-08-29: 60 mg via INTRATHECAL

## 2024-08-29 MED ORDER — BUPIVACAINE-EPINEPHRINE (PF) 0.25% -1:200000 IJ SOLN
INTRAMUSCULAR | Status: AC
  Filled 2024-08-29: qty 30

## 2024-08-29 SURGICAL SUPPLY — 42 items
BAG COUNTER SPONGE SURGICOUNT (BAG) IMPLANT
BAG ZIPLOCK 12X15 (MISCELLANEOUS) ×1 IMPLANT
BEARING PERSONA SZ F 8 (Knees) IMPLANT
BLADE SAW RECIPROCATING 77.5 (BLADE) ×1 IMPLANT
BLADE SAW SGTL 13.0X1.19X90.0M (BLADE) ×1 IMPLANT
BNDG ELASTIC 4INX 5YD STR LF (GAUZE/BANDAGES/DRESSINGS) IMPLANT
BNDG ELASTIC 6INX 5YD STR LF (GAUZE/BANDAGES/DRESSINGS) ×1 IMPLANT
BOWL SMART MIX CTS (DISPOSABLE) ×1 IMPLANT
CEMENT HV SMART SET (Cement) ×1 IMPLANT
COMPONENT TIB PERSONA SZ F LT (Knees) IMPLANT
COVER SURGICAL LIGHT HANDLE (MISCELLANEOUS) ×1 IMPLANT
CUFF TRNQT CYL 34X4.125X (TOURNIQUET CUFF) ×1 IMPLANT
DERMABOND ADVANCED .7 DNX12 (GAUZE/BANDAGES/DRESSINGS) ×1 IMPLANT
DRAPE U-SHAPE 47X51 STRL (DRAPES) ×1 IMPLANT
DRESSING AQUACEL AG SP 3.5X10 (GAUZE/BANDAGES/DRESSINGS) ×1 IMPLANT
DURAPREP 26ML APPLICATOR (WOUND CARE) ×1 IMPLANT
ELECT REM PT RETURN 15FT ADLT (MISCELLANEOUS) ×1 IMPLANT
GLOVE BIO SURGEON STRL SZ 6 (GLOVE) ×2 IMPLANT
GLOVE BIOGEL PI IND STRL 6.5 (GLOVE) ×1 IMPLANT
GLOVE BIOGEL PI IND STRL 7.5 (GLOVE) ×1 IMPLANT
GLOVE ORTHO TXT STRL SZ7.5 (GLOVE) ×2 IMPLANT
GOWN STRL REUS W/ TWL LRG LVL3 (GOWN DISPOSABLE) ×2 IMPLANT
HOLDER FOLEY CATH W/STRAP (MISCELLANEOUS) ×1 IMPLANT
INSERTER TIP PARTIAL KNEE (MISCELLANEOUS) IMPLANT
KIT TURNOVER KIT A (KITS) ×1 IMPLANT
MANIFOLD NEPTUNE II (INSTRUMENTS) ×1 IMPLANT
NDL SAFETY ECLIPSE 18X1.5 (NEEDLE) ×1 IMPLANT
PACK TOTAL KNEE CUSTOM (KITS) ×1 IMPLANT
PENCIL SMOKE EVACUATOR (MISCELLANEOUS) ×1 IMPLANT
PIN DRILL HDLS TROCAR 75 4PK (PIN) IMPLANT
SCREW HEADED 33MM KNEE (MISCELLANEOUS) IMPLANT
SCREW HEADED 48MM KNEE (MISCELLANEOUS) IMPLANT
SET PAD KNEE POSITIONER (MISCELLANEOUS) ×1 IMPLANT
SPIKE FLUID TRANSFER (MISCELLANEOUS) ×1 IMPLANT
SUT MNCRL AB 4-0 PS2 18 (SUTURE) ×1 IMPLANT
SUT STRATAFIX PDS+ 0 24IN (SUTURE) ×1 IMPLANT
SUT VIC AB 1 CT1 36 (SUTURE) ×1 IMPLANT
SUT VIC AB 2-0 CT1 TAPERPNT 27 (SUTURE) ×2 IMPLANT
SYR 3ML LL SCALE MARK (SYRINGE) ×1 IMPLANT
SYSTEM KNEE PART FEM LM SZ 4 (Joint) IMPLANT
TRAY FOLEY MTR SLVR 16FR STAT (SET/KITS/TRAYS/PACK) ×1 IMPLANT
WRAP KNEE MAXI GEL POST OP (GAUZE/BANDAGES/DRESSINGS) ×1 IMPLANT

## 2024-08-29 NOTE — Evaluation (Signed)
 Physical Therapy Evaluation Patient Details Name: Lucas Santiago MRN: [id number] DOB: July 10, 1951 Today's Date: 08/29/2024  History of Present Illness  74 yo male presents to therapy s/p L medial knee arthoplasty on 08/29/2024 due to failure of conservative measures. Pt PMH includes but is not limited to: R unicompartmental knee arthoplasty (2014), anxiety, arthritis, depression, GERD, HTN, and HLD.  Clinical Impression    Lucas Santiago is a 74 y.o. male POD 0 s/p L unicompartmental knee arthoplasty. Patient reports IND with mobility at baseline. Patient is now limited by functional impairments (see PT problem list below) and requires CGA for transfers and gait with RW. Patient was able to ambulate 50 and 60 feet with RW and CGA to close S and cues for safe walker management. Patient educated on safe functional mobility tasks, fall risk prevention, pain management and goal, use of CP/ice man machine, slowly increasing activity level and car transfers pt and spouse verbalized understanding of safe guarding position for people assisting with mobility. Patient instructed in exercises to facilitate ROM and circulation reviewed and HO provided. Patient will benefit from continued skilled PT interventions to address impairments and progress towards PLOF. Patient has met mobility goals at adequate level for discharge home with family support and OPPT services; will continue to follow if pt continues acute stay to progress towards Mod I goals.       If plan is discharge home, recommend the following: A little help with walking and/or transfers;A little help with bathing/dressing/bathroom;Assistance with cooking/housework;Assist for transportation;Help with stairs or ramp for entrance   Can travel by private vehicle        Equipment Recommendations None recommended by PT  Recommendations for Other Services       Functional Status Assessment Patient has had a recent decline in their functional status and  demonstrates the ability to make significant improvements in function in a reasonable and predictable amount of time.     Precautions / Restrictions Precautions Precautions: Fall;Knee Restrictions Weight Bearing Restrictions Per Provider Order: No      Mobility  Bed Mobility Overal bed mobility: Needs Assistance Bed Mobility: Supine to Sit     Supine to sit: Supervision     General bed mobility comments: min cues    Transfers Overall transfer level: Needs assistance Equipment used: Rolling walker (2 wheels) Transfers: Sit to/from Stand Sit to Stand: Contact guard assist           General transfer comment: min cues    Ambulation/Gait Ambulation/Gait assistance: Contact guard assist Gait Distance (Feet): 60 Feet Assistive device: Rolling walker (2 wheels) Gait Pattern/deviations: Step-to pattern, Decreased stance time - left, Antalgic, Trunk flexed Gait velocity: decreased     General Gait Details: slight trunk flexion with B UE support at RW to offload L LE in stance phase, min cues for safety and RW management  Stairs            Wheelchair Mobility     Tilt Bed    Modified Rankin (Stroke Patients Only)       Balance Overall balance assessment: Needs assistance Sitting-balance support: Feet supported Sitting balance-Leahy Scale: Normal     Standing balance support: Bilateral upper extremity supported, During functional activity, Reliant on assistive device for balance Standing balance-Leahy Scale: Fair Standing balance comment: static standing no UE support no overt LOB  Pertinent Vitals/Pain Pain Assessment Pain Assessment: 0-10 Pain Score: 2  Pain Location: L knee and LE Pain Descriptors / Indicators: Aching, Constant, Dull, Operative site guarding Pain Intervention(s): Limited activity within patient's tolerance, Monitored during session, Premedicated before session, Repositioned, Ice applied     Home Living Family/patient expects to be discharged to:: Private residence Living Arrangements: Spouse/significant other Available Help at Discharge: Family Type of Home: House Home Access: Ramped entrance       Home Layout: Two level;Able to live on main level with bedroom/bathroom Home Equipment: Rolling Walker (2 wheels);Cane - single point;Shower seat Additional Comments: ice man machine    Prior Function Prior Level of Function : Independent/Modified Independent;Driving             Mobility Comments: IND no AD for all ADLs, self care tasks and IADLs       Extremity/Trunk Assessment        Lower Extremity Assessment Lower Extremity Assessment: LLE deficits/detail LLE Deficits / Details: ankle DF/PF; SLR < 10 degree lag LLE Sensation: WNL    Cervical / Trunk Assessment Cervical / Trunk Assessment: Normal  Communication   Communication Communication: No apparent difficulties    Cognition Arousal: Alert Behavior During Therapy: WFL for tasks assessed/performed   PT - Cognitive impairments: No apparent impairments                         Following commands: Intact       Cueing       General Comments      Exercises Total Joint Exercises Ankle Circles/Pumps: AROM, Both, 10 reps Quad Sets: AROM, Left, 5 reps Short Arc Quad: AROM, Left, 5 reps Heel Slides: AROM, Left, 5 reps Hip ABduction/ADduction: AROM, Left, 5 reps Straight Leg Raises: AROM, Left, 5 reps Knee Flexion: AROM, Left, 5 reps, Seated   Assessment/Plan    PT Assessment Patient needs continued PT services  PT Problem List Decreased strength;Decreased range of motion;Decreased activity tolerance;Decreased balance;Decreased mobility;Decreased coordination;Pain       PT Treatment Interventions DME instruction;Gait training;Stair training;Functional mobility training;Therapeutic activities;Therapeutic exercise;Balance training;Neuromuscular re-education;Patient/family  education;Modalities    PT Goals (Current goals can be found in the Care Plan section)  Acute Rehab PT Goals Patient Stated Goal: to be able to hiking in [place] PT Goal Formulation: With patient Time For Goal Achievement: 09/12/24 Potential to Achieve Goals: Good    Frequency 7X/week     Co-evaluation               AM-PAC PT 6 Clicks Mobility  Outcome Measure Help needed turning from your back to your side while in a flat bed without using bedrails?: None Help needed moving from lying on your back to sitting on the side of a flat bed without using bedrails?: A Little Help needed moving to and from a bed to a chair (including a wheelchair)?: A Little Help needed standing up from a chair using your arms (e.g., wheelchair or bedside chair)?: A Little Help needed to walk in hospital room?: A Little Help needed climbing 3-5 steps with a railing? : A Little 6 Click Score: 19    End of Session Equipment Utilized During Treatment: Gait belt Activity Tolerance: Patient tolerated treatment well;No increased pain Patient left: in chair;with call bell/phone within reach;with family/visitor present Nurse Communication: Mobility status;Other (comment) (pt readiness for same day d/c) PT Visit Diagnosis: Unsteadiness on feet (R26.81);Other abnormalities of gait and mobility (R26.89);Muscle weakness (generalized) (M62.81);Difficulty in  walking, not elsewhere classified (R26.2);Pain Pain - Right/Left: Left Pain - part of body: Knee;Leg    Time: 8949-8871 PT Time Calculation (min) (ACUTE ONLY): 38 min   Charges:   PT Evaluation $PT Eval Low Complexity: 1 Low PT Treatments $Gait Training: 8-22 mins $Therapeutic Exercise: 8-22 mins PT General Charges $$ ACUTE PT VISIT: 1 Visit         [place], PT Acute Rehab   [place] VEAR Drone 08/29/2024, 12:13 PM

## 2024-08-29 NOTE — Anesthesia Procedure Notes (Signed)
 Procedure Name: MAC Date/Time: 08/29/2024 7:12 AM  Performed by: Buster Catheryn SAUNDERS, CRNAPre-anesthesia Checklist: Patient identified, Emergency Drugs available, Suction available, Patient being monitored and Timeout performed Patient Re-evaluated:Patient Re-evaluated prior to induction Oxygen Delivery Method: Simple face mask Airway Equipment and Method: Oral airway Placement Confirmation: positive ETCO2 Dental Injury: Teeth and Oropharynx as per pre-operative assessment

## 2024-08-29 NOTE — Interval H&P Note (Signed)
 History and Physical Interval Note:  08/29/2024 6:58 AM  Lucas Santiago  has presented today for surgery, with the diagnosis of Left knee osteoarthritis.  The various methods of treatment have been discussed with the patient and family. After consideration of risks, benefits and other options for treatment, the patient has consented to  Procedures with comments: ARTHROPLASTY, KNEE, UNICOMPARTMENTAL (Left) - Medially as a surgical intervention.  The patient's history has been reviewed, patient examined, no change in status, stable for surgery.  I have reviewed the patient's chart and labs.  Questions were answered to the patient's satisfaction.     Donnice JONETTA Car

## 2024-08-29 NOTE — Op Note (Signed)
 NAME: Lucas Santiago    MEDICAL RECORD NO.: [id number]   FACILITY: [facility]   DATE OF BIRTH: 08-12-1951  PHYSICIAN: Donnice BIRCH. Ernie, M.D.    DATE OF PROCEDURE: 08/29/2024    OPERATIVE REPORT   PREOPERATIVE DIAGNOSIS: left knee medial compartment osteoarthritis.   POSTOPERATIVE DIAGNOSIS: left knee medial compartment osteoarthritis.   PROCEDURE: left partial knee replacement utilizing [employer] fixed bearing knee system with a size left 4 femur, a left medial size F tibial tray with a size 8 mm insert.   SURGEON: Donnice BIRCH. Ernie, M.D.   ASSISTANT: Rosina Calin, [facility].  Please note that PA Calin was present for the entirety of the case,  utilized for preoperative positioning, perioperative retractor  management, general facilitation of the case and primary wound closure.   ANESTHESIA: regional plus spinal.   SPECIMENS: None.   COMPLICATIONS: None.  DRAINS: None  EBL: <200 cc   TOURNIQUET TIME: no tourniquet was used   INDICATIONS FOR PROCEDURE: The patient is a 74 y.o. patient of mine who presented for evaluation of left knee pain.  They presented with primary complaints of pain on the medial side of their knee. Radiographs revealed advanced medial compartment arthritis with specifically an antero-medial wear pattern.  There was bone on bone changes noted with subchondral sclerosis and osteophytes present. The patient has had progressive problems failing to respond to conservative measures of medications, injections and activity modification. Risks of infection, DVT, component failure, need for future revision surgery were all discussed and reviewed.  Consent was obtained for benefit of pain relief.   PROCEDURE IN DETAIL: The patient was brought to the operative theater.  Once adequate anesthesia, preoperative antibiotics, 2 gm of Ancef  administered, 1 gm of Tranexamic Acid , and 10 mg of Decadron  given the patient was positioned in supine position with bony prominences  padded and protected.  The operative leg was placed into the [facility] leg holder. The left lower extremity was prepped and draped in sterile fashion. A time-out  was performed identifying the patient, planned procedure, and extremity.  A paramidline  incision was made from the proximal pole of the patella to the tibial tubercle. A  soft tissue plane was created and partial median arthrotomy was then  made to allow for subluxation of the patella. Following initial synovectomy and  debridement, the osteophytes were removed off the medial aspect of the knee and within the notch as needed.  Using the extramedullary guide a proximal medial osteotomy was made referenced with a 4 mm stylus.  The was then brought to extension to confirm that the gap was stable with a 9 mm gauge.  At this point I sized the femur to be a 4.  The 4 femoral cutting guide was pinned into position and the appropriate cuts and drill hole made.  The tibia was now sized to be a F.  This trial tray was pinned into position the tibial lug holes drilled.  We now carried out a trial reduction and found that the 8 insert fit most appropriately per technique with the 2 mm feeler gauge placed.  Based on these findings the trial components were removed.  Final components were opened.The knee was irrigated with  normal saline solution.  The posterior medial synovial capsule was injected with 30 cc of quarter percent Marcaine  with epinephrine , 1 cc of Toradol , and 30 cc of normal saline.  Any final debridements of the soft tissue or osteophytes was carried out at this point.  Sclerotic  bone on the distal femur and proximal tibia were drilled for cementing.   The final components were cemented onto a clean and dried cut surfaces of bone. Excessive cement was removed and the knee was brought to extension with the 2 mm feeler gauge positioned until the cement cured. Excess cement was removed throughout the knee.   After the cement had fully cured  any remaining cement was removed throughout the knee there was no further visualized cement present.    The final size 8 insert to match the left F tibial tray was opened and then impacted into the tibial baseplate.   The extensor mechanism was then reapproximated using a #1 Vicryl and #1 Stratafix suture with the knee in flexion. The  remaining wound was closed with 2-0 Vicryl and a running 4-0 Monocryl.  The knee was cleaned, dried, and dressed sterilely using Dermabond and  Aquacel dressing. The patient  was brought to the recovery room, Ace wrap in place, tolerating the  procedure well.     Donnice CORDOBA Ernie, M.D.

## 2024-08-29 NOTE — Anesthesia Procedure Notes (Addendum)
 Spinal  Patient location during procedure: OR End time: 08/29/2024 7:18 AM Reason for block: surgical anesthesia  Staffing Performed: anesthesiologist  Authorized by: Jefm Garnette LABOR, MD   Performed by: Jefm Garnette LABOR, MD  Preanesthetic Checklist Completed: patient identified, IV checked, risks and benefits discussed, surgical consent, monitors and equipment checked, pre-op evaluation and timeout performed Spinal Block Patient position: sitting Prep: DuraPrep and site prepped and draped Patient monitoring: heart rate, cardiac monitor, continuous pulse ox and blood pressure Approach: midline Location: L3-4 Injection technique: single-shot Needle Needle type: Pencan  Needle gauge: 24 G Needle length: 10 cm Needle insertion depth (cm): 6 Assessment Sensory level: T4 Events: CSF return  Additional Notes 1 Attempt (s). Pt tolerated procedure well.

## 2024-08-29 NOTE — Discharge Instructions (Signed)

## 2024-08-29 NOTE — Anesthesia Procedure Notes (Addendum)
 Anesthesia Regional Block: Adductor canal block   Pre-Anesthetic Checklist: , timeout performed,  Correct Patient, Correct Site, Correct Laterality,  Correct Procedure, Correct Position, site marked,  Risks and benefits discussed,  Surgical consent,  Pre-op evaluation,  At surgeon's request and post-op pain management  Laterality: Lower and Left  Prep: chloraprep       Needles:  Injection technique: Single-shot  Needle Type: Echogenic Needle     Needle Length: 9cm  Needle Gauge: 22     Additional Needles:   Procedures:,,,, ultrasound used (permanent image in chart),,    Narrative:  Start time: 08/29/2024 6:45 AM End time: 08/29/2024 6:49 AM Injection made incrementally with aspirations every 5 mL.  Performed by: Personally  Anesthesiologist: Jefm Garnette LABOR, MD  Additional Notes: Block assessed prior to surgery. Pt tolerated procedure well.

## 2024-08-29 NOTE — Care Plan (Signed)
 Ortho Bundle Case Management Note  Patient Details  Name: Lucas Santiago MRN: [id number] Date of Birth: 1950/10/17  L medial UKA on 08/29/24  DCP: Home with wife  DME: No needs  PT: [facility]                   DME Arranged:  N/A DME Agency:  NA  HH Arranged:    HH Agency:     Additional Comments: Please contact me with any questions of if this plan should need to change.  Lyle Pepper, CCM [facility] [phone]  Ext. [phone]   08/29/2024, 8:19 AM

## 2024-08-29 NOTE — Anesthesia Postprocedure Evaluation (Signed)
"   Anesthesia Post Note  Patient: Lucas Santiago  Procedure(s) Performed: ARTHROPLASTY, KNEE, UNICOMPARTMENTAL (Left: Knee)     Patient location during evaluation: Nursing Unit Anesthesia Type: Regional and Spinal Level of consciousness: oriented and awake and alert Pain management: pain level controlled Vital Signs Assessment: post-procedure vital signs reviewed and stable Respiratory status: spontaneous breathing and respiratory function stable Cardiovascular status: blood pressure returned to baseline and stable Postop Assessment: no headache, no backache, no apparent nausea or vomiting and patient able to bend at knees Anesthetic complications: no   No notable events documented.  Last Vitals:  Vitals:   08/29/24 1000 08/29/24 1045  BP: 120/87 113/81  Pulse: 85 81  Resp:    Temp:    SpO2: 99% 95%    Last Pain:  Vitals:   08/29/24 1045  TempSrc:   PainSc: 0-No pain                 [name]      "

## 2024-08-29 NOTE — Transfer of Care (Signed)
 Immediate Anesthesia Transfer of Care Note  Patient: Lucas Santiago  Procedure(s) Performed: ARTHROPLASTY, KNEE, UNICOMPARTMENTAL (Left: Knee)  Patient Location: PACU  Anesthesia Type:Spinal  Level of Consciousness: awake, alert , and oriented  Airway & Oxygen Therapy: Patient Spontanous Breathing and Patient connected to face mask oxygen  Post-op Assessment: Report given to RN and Post -op Vital signs reviewed and stable  Post vital signs: Reviewed and stable  Last Vitals:  Vitals Value Taken Time  BP 104/67 08/29/24 08:50  Temp    Pulse 85 08/29/24 08:51  Resp 12 08/29/24 08:51  SpO2 95 % 08/29/24 08:51  Vitals shown include unfiled device data.  Last Pain:  Vitals:   08/29/24 0549  TempSrc: Oral         Complications: No notable events documented.

## 2024-08-30 ENCOUNTER — Encounter (HOSPITAL_COMMUNITY): Payer: Self-pay | Admitting: Orthopedic Surgery
